# Patient Record
Sex: Female | Born: 1950 | Race: Black or African American | Hispanic: No | Marital: Married | Smoking: Never smoker
Health system: Southern US, Community
[De-identification: ages and names within clinical notes are randomized; demographics above are authoritative.]

## PROBLEM LIST (undated history)

## (undated) DIAGNOSIS — G4730 Sleep apnea, unspecified: Secondary | ICD-10-CM

## (undated) DIAGNOSIS — M1990 Unspecified osteoarthritis, unspecified site: Secondary | ICD-10-CM

## (undated) DIAGNOSIS — E669 Obesity, unspecified: Secondary | ICD-10-CM

## (undated) DIAGNOSIS — I10 Essential (primary) hypertension: Secondary | ICD-10-CM

## (undated) DIAGNOSIS — B029 Zoster without complications: Secondary | ICD-10-CM

## (undated) DIAGNOSIS — J309 Allergic rhinitis, unspecified: Secondary | ICD-10-CM

## (undated) DIAGNOSIS — E785 Hyperlipidemia, unspecified: Secondary | ICD-10-CM

## (undated) DIAGNOSIS — E119 Type 2 diabetes mellitus without complications: Secondary | ICD-10-CM

## (undated) HISTORY — DX: Sleep apnea, unspecified: G47.30

## (undated) HISTORY — DX: Obesity, unspecified: E66.9

## (undated) HISTORY — DX: Unspecified osteoarthritis, unspecified site: M19.90

## (undated) HISTORY — DX: Hypercalcemia: E83.52

## (undated) HISTORY — PX: VAGINAL HYSTERECTOMY: SUR661

## (undated) HISTORY — DX: Zoster without complications: B02.9

## (undated) HISTORY — DX: Essential (primary) hypertension: I10

## (undated) HISTORY — DX: Type 2 diabetes mellitus without complications: E11.9

## (undated) HISTORY — DX: Hyperlipidemia, unspecified: E78.5

## (undated) HISTORY — DX: Allergic rhinitis, unspecified: J30.9

---

## 2005-03-13 HISTORY — PX: BREAST BIOPSY: SHX20

## 2008-03-13 HISTORY — PX: CATARACT EXTRACTION: SUR2

## 2010-03-13 DIAGNOSIS — B029 Zoster without complications: Secondary | ICD-10-CM

## 2010-03-13 HISTORY — DX: Zoster without complications: B02.9

## 2014-08-06 ENCOUNTER — Encounter: Payer: Self-pay | Admitting: Internal Medicine

## 2014-10-07 ENCOUNTER — Ambulatory Visit: Payer: Self-pay | Admitting: Internal Medicine

## 2014-10-09 ENCOUNTER — Ambulatory Visit (INDEPENDENT_AMBULATORY_CARE_PROVIDER_SITE_OTHER): Payer: Medicare Other | Admitting: Internal Medicine

## 2014-10-09 ENCOUNTER — Encounter: Payer: Self-pay | Admitting: Internal Medicine

## 2014-10-09 VITALS — BP 160/90 | HR 80 | Ht 62.5 in | Wt 176.4 lb

## 2014-10-09 DIAGNOSIS — Z1211 Encounter for screening for malignant neoplasm of colon: Secondary | ICD-10-CM

## 2014-10-09 DIAGNOSIS — R030 Elevated blood-pressure reading, without diagnosis of hypertension: Secondary | ICD-10-CM

## 2014-10-09 DIAGNOSIS — IMO0001 Reserved for inherently not codable concepts without codable children: Secondary | ICD-10-CM

## 2014-10-09 DIAGNOSIS — K219 Gastro-esophageal reflux disease without esophagitis: Secondary | ICD-10-CM | POA: Diagnosis not present

## 2014-10-09 NOTE — Patient Instructions (Addendum)
Please follow the GERD diet we have given you today.  If the GERD diet alone does not help your symptoms, you may purchase Pepcid AC over the counter and take 1 tablet every night.  You have been scheduled for a colonoscopy. Please follow written instructions given to you at your visit today.  Please pick up your prep supplies at the pharmacy within the next 1-3 days. If you use inhalers (even only as needed), please bring them with you on the day of your procedure. Your physician has requested that you go to www.startemmi.com and enter the access code given to you at your visit today. This web site gives a general overview about your procedure. However, you should still follow specific instructions given to you by our office regarding your preparation for the procedure.  Today your blood pressure was elevated. Please follow up with your Primary Care Provider for blood pressure management.

## 2014-10-09 NOTE — Progress Notes (Signed)
   Subjective:    Patient ID: Jennifer Osborn, female    DOB: 1950/06/29, 64 y.o.   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 Once maybe twice a week she will awaken with regurgitation and some coughing. She eats at about 7 PM and retires to bed at 10:50 PM. There is no dysphagia or unintentional weight loss or bleeding. She has not tried any medications or dietary changes or change in the elevation of her bed. GI review of systems is otherwise negative.  Medications, allergies, past medical history, past surgical history, family history and social history are reviewed and updated in the EMR.  Review of Systems Recent skin rash all other review of systems negative.    Objective:   Physical Exam  170/90 mmHg  Pulse 80  Ht 5' 2.5" (1.588 m)  Wt 176 lb 6 oz (80.003 kg)  BMI 31.73 kg/m2@  General:  NAD Eyes:   anicteric Lungs:  clear Heart:: S1S2 no rubs, murmurs or gallops Abdomen:  soft and nontender, BS+ Ext:   no edema, cyanosis or clubbing Psych:  Alert and oriented x 3     Assessment & Plan:  Reflux - Plan: Ambulatory referral to Gastroenterology  Colon cancer screening - Plan: Ambulatory referral to Gastroenterology  Elevated BP  1. GERD diet 2. Possible H2 B at bedtime 3. Colonoscopy (Cologuard offered) The risks and benefits as well as alternatives of endoscopic procedure(s) have been discussed and reviewed. All questions answered. The patient agrees to proceed. She says BP at home is ok usu - f/u PCP

## 2014-11-19 ENCOUNTER — Encounter: Payer: Self-pay | Admitting: Internal Medicine

## 2014-11-19 ENCOUNTER — Ambulatory Visit (AMBULATORY_SURGERY_CENTER): Payer: Medicare Other | Admitting: Internal Medicine

## 2014-11-19 VITALS — BP 123/61 | HR 62 | Temp 98.2°F | Resp 21 | Ht 62.5 in | Wt 176.0 lb

## 2014-11-19 DIAGNOSIS — Z1211 Encounter for screening for malignant neoplasm of colon: Secondary | ICD-10-CM

## 2014-11-19 MED ORDER — SODIUM CHLORIDE 0.9 % IV SOLN: 500.0000 mL | INTRAVENOUS | Status: DC

## 2014-11-19 NOTE — Progress Notes (Signed)
  [facility] Anesthesia Post-op Note  Patient: Jennifer Osborn  Procedure(s) Performed: colonoscopy  Patient Location: [facility] - Recovery Area  Anesthesia Type: Deep Sedation/Propofol  Level of Consciousness: awake, oriented and patient cooperative  Airway and Oxygen Therapy: Patient Spontanous Breathing  Post-op Pain: none  Post-op Assessment:  Post-op Vital signs reviewed, Patient's Cardiovascular Status Stable, Respiratory Function Stable, Patent Airway, No signs of Nausea or vomiting and Pain level controlled  Post-op Vital Signs: Reviewed and stable  Complications: No apparent anesthesia complications  Novella Abraha E 12:14 PM

## 2014-11-19 NOTE — Patient Instructions (Addendum)
Colonoscopy was normal!  Consider having another screening in 10 years [id number]).  I appreciate the opportunity to care for you. Iva Boop, MD, FACG   YOU HAD AN ENDOSCOPIC PROCEDURE TODAY AT THE [facility]:   Refer to the procedure report that was given to you for any specific questions about what was found during the examination.  If the procedure report does not answer your questions, please call your gastroenterologist to clarify.  If you requested that your care partner not be given the details of your procedure findings, then the procedure report has been included in a sealed envelope for you to review at your convenience later.  YOU SHOULD EXPECT: Some feelings of bloating in the abdomen. Passage of more gas than usual.  Walking can help get rid of the air that was put into your GI tract during the procedure and reduce the bloating. If you had a lower endoscopy (such as a colonoscopy or flexible sigmoidoscopy) you may notice spotting of blood in your stool or on the toilet paper. If you underwent a bowel prep for your procedure, you may not have a normal bowel movement for a few days.  Please Note:  You might notice some irritation and congestion in your nose or some drainage.  This is from the oxygen used during your procedure.  There is no need for concern and it should clear up in a day or so.  SYMPTOMS TO REPORT IMMEDIATELY:   Following lower endoscopy (colonoscopy or flexible sigmoidoscopy):  Excessive amounts of blood in the stool  Significant tenderness or worsening of abdominal pains  Swelling of the abdomen that is new, acute  Fever of 100F or higher   Following upper endoscopy (EGD)  Vomiting of blood or coffee ground material  New chest pain or pain under the shoulder blades  Painful or persistently difficult swallowing  New shortness of breath  Fever of 100F or higher  Black, tarry-looking stools  For urgent or emergent issues, a  gastroenterologist can be reached at any hour by calling [phone].   DIET: Your first meal following the procedure should be a small meal and then it is ok to progress to your normal diet. Heavy or fried foods are harder to digest and may make you feel nauseous or bloated.  Likewise, meals heavy in dairy and vegetables can increase bloating.  Drink plenty of fluids but you should avoid alcoholic beverages for 24 hours.  ACTIVITY:  You should plan to take it easy for the rest of today and you should NOT DRIVE or use heavy machinery until tomorrow (because of the sedation medicines used during the test).    FOLLOW UP: Our staff will call the number listed on your records the next business day following your procedure to check on you and address any questions or concerns that you may have regarding the information given to you following your procedure. If we do not reach you, we will leave a message.  However, if you are feeling well and you are not experiencing any problems, there is no need to return our call.  We will assume that you have returned to your regular daily activities without incident.  If any biopsies were taken you will be contacted by phone or by letter within the next 1-3 weeks.  Please call us at [phone] if you have not heard about the biopsies in 3 weeks.    SIGNATURES/CONFIDENTIALITY: You and/or your care partner have signed paperwork  which will be entered into your electronic medical record.  These signatures attest to the fact that that the information above on your After Visit Summary has been reviewed and is understood.  Full responsibility of the confidentiality of this discharge information lies with you and/or your care-partner.   Repeat colonoscopy in 10 years

## 2014-11-19 NOTE — Op Note (Signed)
[facility] [address] [place], [address]   COLONOSCOPY PROCEDURE REPORT  PATIENT: Jennifer, Osborn  MR#: [id number] BIRTHDATE: 05/26/1950 , 64  yrs. old GENDER: female ENDOSCOPIST: Iva Boop, MD, [facility] PROCEDURE DATE:  11/19/2014 PROCEDURE:   Colonoscopy, screening First Screening Colonoscopy - Avg.  risk and is 50 yrs.  old or older Yes.  Prior Negative Screening - Now for repeat screening. N/A  History of Adenoma - Now for follow-up colonoscopy & has been > or = to 3 yrs.  N/A  Polyps removed today? No Recommend repeat exam, <10 yrs? No ASA CLASS:   Class II INDICATIONS:Screening for colonic neoplasia and Colorectal Neoplasm Risk Assessment for this procedure is average risk. MEDICATIONS: Propofol 200 mg IV and Monitored anesthesia care  DESCRIPTION OF PROCEDURE:   After the risks benefits and alternatives of the procedure were thoroughly explained, informed consent was obtained.  The digital rectal exam revealed no abnormalities of the rectum.   The LB [id number]  endoscope was introduced through the anus and advanced to the cecum, which was identified by both the appendix and ileocecal valve. No adverse events experienced.   The quality of the prep was excellent. (MiraLax was used)  The instrument was then slowly withdrawn as the colon was fully examined. Estimated blood loss is zero unless otherwise noted in this procedure report.      COLON FINDINGS: A normal appearing cecum, ileocecal valve, and appendiceal orifice were identified.  The ascending, transverse, descending, sigmoid colon, and rectum appeared unremarkable. Retroflexed views revealed no abnormalities. The time to cecum = 2.3 Withdrawal time = 11.0   The scope was withdrawn and the procedure completed. COMPLICATIONS: There were no immediate complications.  ENDOSCOPIC IMPRESSION: Normal colonoscopy - excellent prep  RECOMMENDATIONS: Consider repeat colon screening 10 years.   2026  eSigned:  Iva Boop, MD, [facility] 11/19/2014 12:17 PM   cc: the Patient and Dr. Tana Felts

## 2014-11-20 ENCOUNTER — Telehealth: Payer: Self-pay | Admitting: *Deleted

## 2014-11-20 NOTE — Telephone Encounter (Signed)
Unable to leave message ,not available said voice mail

## 2016-05-16 DIAGNOSIS — Z961 Presence of intraocular lens: Secondary | ICD-10-CM | POA: Insufficient documentation

## 2017-07-17 ENCOUNTER — Encounter: Payer: Self-pay | Admitting: Neurology

## 2017-07-31 NOTE — Progress Notes (Signed)
[facility] Clinic Note - Initial Visit   Date: 08/01/17  Jennifer Osborn MRN: [id number] DOB: 05-Apr-1950   Dear Dr. Vertell Limber:  Thank you for your kind referral of Jennifer Osborn for consultation of left arm weakness and pain. Although her history is well known to you, please allow [place] to reiterate it for the purpose of our medical record. The patient was accompanied to the clinic by self.   History of Present Illness: Jennifer Osborn is a 67 y.o. right-handed African American female with hypertension, hyperlipidemia, and diabetes (HbA1c 6.1) presenting for evaluation of left arm weakness and pain.    Starting around 2017, she began having numbness involving the entire left arm, which has been constant since onset.  No identifiable triggers or alleviating factors.  Recently, she has been having tight sensation over the upper arm.  She has difficulty with fine motor tasks because she drops things frequently from the numbness in her fingers.  In 2018, she developed mild tingling over the fingertips on the right. Prior evaluation has included electrodiagnostic testing which shows mild carpal tunnel syndrome bilaterally. 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    She denies any numbness/tingling of the legs, weakness of the arms or legs, vision changes, or difficulty swallowing/talking.  She occasionally has left leg cramps and episodic jerks.    She did not tolerate wrist splints, so no longer uses them.  She has not done physical therapy.   Out-side paper records, electronic medical record, and images have been reviewed where available and summarized as:  MRI cervical spine without contrast 07/10/2017: Left-sided cord lesion at the C2-3 level unchanged from 12/27/2015. No other cord lesions. This appears to be chronic cord injury possibly chronic demyelinating disease, infarct, or cord injury.  No stenosis is present at this level. Multilevel disc and facet degeneration similar to the prior study.  NCS/EMG 08/15/2016 performed at [facility] by Dr. Brien Few:  Mild bilateral median neuropathy at the wrist (carpal tunnel syndrome).  No evidence for cervical radiculopathy.   Past Medical History:  Diagnosis Date  . Allergic rhinitis   . Arthritis   . DJD (degenerative joint disease)   . DM (diabetes mellitus), type 2 ([place])   . Hypercalcemia   . Hyperlipidemia   . Hypertension   . Obesity   . Shingles 2012   upperback  . Sleep apnea     Past Surgical History:  Procedure Laterality Date  . BREAST BIOPSY Right 2007  . CATARACT EXTRACTION Bilateral 2010   with lens inplants  . VAGINAL HYSTERECTOMY       Medications:  Outpatient Encounter Medications as of 08/01/2017  Medication Sig Note  . aspirin 81 MG tablet Take 81 mg by mouth daily.   . carvedilol (COREG) 12.5 MG tablet Take 12.5 mg by mouth 2 (two) times daily with a meal.   . clobetasol cream (TEMOVATE) 0.05 %  11/19/2014: Received from: External Pharmacy  . ergocalciferol (VITAMIN D2) 50000 UNITS capsule Take 50,000 Units by mouth every 30 (thirty) days.   Marland Kitchen losartan-hydrochlorothiazide (HYZAAR) 100-25 MG per tablet Take 1 tablet by mouth daily.   . Magnesium Gluconate 550 MG TABS Take by mouth.   . metFORMIN (GLUCOPHAGE) 500 MG tablet Take 500 mg by mouth daily with breakfast.   . rosuvastatin (CRESTOR) 20 MG tablet    . [DISCONTINUED] atorvastatin (LIPITOR) 40 MG tablet  Take 40 mg by mouth daily.   . [DISCONTINUED] Multiple Vitamin (MULTIVITAMIN) tablet Take 1 tablet by mouth daily.    No facility-administered encounter medications on file as of 08/01/2017.      Allergies:  Allergies  Allergen Reactions  . Percocet [Oxycodone-Acetaminophen]   . Latex Rash    Family History: Family History  Problem Relation Age of Onset  . Diabetes Sister   . Diabetes Father   . Prostate cancer Father    . Diabetes Brother   . Pancreatic cancer Mother   . Pancreatic cancer Brother   . Prostate cancer Brother   . Colon cancer Neg Hx     Social History: Social History   Tobacco Use  . Smoking status: Never Smoker  . Smokeless tobacco: Never Used  Substance Use Topics  . Alcohol use: No    Alcohol/week: 0.0 oz  . Drug use: No   Social History   Social History Narrative   Married, no children. Retired Tax inspector. One caffeinated beverage daily.   10/09/2014  Lives in a one story home.      Review of Systems:  CONSTITUTIONAL: No fevers, chills, night sweats, or weight loss.   EYES: No visual changes or eye pain ENT: No hearing changes.  No history of nose bleeds.   RESPIRATORY: No cough, wheezing and shortness of breath.   CARDIOVASCULAR: Negative for chest pain, and palpitations.   GI: Negative for abdominal discomfort, blood in stools or black stools.  No recent change in bowel habits.   GU:  No history of incontinence.   MUSCLOSKELETAL: No history of joint pain or swelling.  No myalgias.   SKIN: Negative for lesions, rash, and itching.   HEMATOLOGY/ONCOLOGY: Negative for prolonged bleeding, bruising easily, and swollen nodes.   ENDOCRINE: Negative for cold or heat intolerance, polydipsia or goiter.   PSYCH:  No depression or anxiety symptoms.   NEURO: As Above.   Vital Signs:  BP 110/70   Pulse 76   Ht _0  (1.626 m)   Wt 173 lb 4 oz (78.6 kg)   SpO2 98%   BMI 29.74 kg/m    General Medical Exam:   General:  Well appearing, comfortable.   Eyes/ENT: see cranial nerve examination.   Neck: No masses appreciated.  Full range of motion without tenderness.  No carotid bruits. Respiratory:  Clear to auscultation, good air entry bilaterally.   Cardiac:  Regular rate and rhythm, no murmur.   Extremities:  No deformities, edema, or skin discoloration.  Skin:  No rashes or lesions.  Neurological Exam: MENTAL STATUS including orientation to time, place,  person, recent and remote memory, attention span and concentration, language, and fund of knowledge is normal.  Speech is not dysarthric.  CRANIAL NERVES: II:  No visual field defects.  Unremarkable fundi.   III-IV-VI: Pupils equal round and reactive to light.  Normal conjugate, extra-ocular eye movements in all directions of gaze.  No nystagmus.  No ptosis.   V:  Normal facial sensation.   VII:  Normal facial symmetry and movements.  No pathologic facial reflexes.  VIII:  Normal hearing and vestibular function.   IX-X:  Normal palatal movement.   XI:  Normal shoulder shrug and head rotation.   XII:  Normal tongue strength and range of motion, no deviation or fasciculation.  MOTOR:  No atrophy, fasciculations or abnormal movements.  No pronator drift.   Negative Lhermittes  Right Upper Extremity:    Left Upper Extremity:  Deltoid  5/5   Deltoid  5/5   Biceps  5/5   Biceps  5/5   Triceps  5/5   Triceps  5/5   Wrist extensors  5/5   Wrist extensors  5/5   Wrist flexors  5/5   Wrist flexors  5/5   Finger extensors  5/5   Finger extensors  5/5   Finger flexors  5/5   Finger flexors  5/5   Dorsal interossei  5/5   Dorsal interossei  5/5   Abductor pollicis  5/5   Abductor pollicis  5/5   Tone (Ashworth scale)  0  Tone (Ashworth scale)  0+   Right Lower Extremity:    Left Lower Extremity:    Hip flexors  5/5   Hip flexors  5/5   Hip extensors  5/5   Hip extensors  5/5   Knee flexors  5/5   Knee flexors  5/5   Knee extensors  5/5   Knee extensors  5/5   Dorsiflexors  5/5   Dorsiflexors  5/5   Plantarflexors  5/5   Plantarflexors  5/5   Toe extensors  5/5   Toe extensors  5/5   Toe flexors  5/5   Toe flexors  5/5   Tone (Ashworth scale)  0  Tone (Ashworth scale)  0   MSRs:  Right                                                                 Left brachioradialis 2+  brachioradialis 3+  biceps 2+  biceps 3+  triceps 2+  triceps 3+  patellar 2+  patellar 2+  ankle jerk 2+  ankle  jerk 2+  Hoffman no  Hoffman no  plantar response down  plantar response down   SENSORY: Vibration is reduced to 50% on the left hand as compared to the right.  Vibration is intact in the legs.  She has hyperesthesia to pin prick in the left arm, temperature is intact bilaterally.  COORDINATION/GAIT: Normal finger-to- nose-finger and heel-to-shin.  Intact rapid alternating movements bilaterally.  Gait narrow based and stable. Tandem and stressed gait intact.    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 Her cord lesion certainly explains her left arm numbness and tightness.  I would like to view her MRI images and requested that she bring CD images.  Further, I recommend MRI brain wwo contrast to assess for demyelinating disease, which would suggest that her cervical lesion is and old MS plaque.  If this is inconclusive, CSF testing could be indicated.  Although less likely, nutrient deficiency could also manifest with similar appearance, so will check ESR, copper, and vitamin B12.  Patient is reluctant to have MRI brain as her symptoms are stable for 2 years.  She will call my office, if she chooses to proceed with testing.   For her left arm numbness, I explained that there is no effective medication, and that occupational therapy and muscle relaxants may provide some relief.  She prefers nonpharmacologic therapies and will do her own stretching exercises at home.  For bilateral carpal tunnel syndrome, encouraged her to use wrist splints especially at nighttime.   Return  to clinic as needed  Thank you for allowing me to participate in patient's care.  If I can answer any additional questions, I would be pleased to do so.    Sincerely,    Donika K. Posey Pronto, DO

## 2017-08-01 ENCOUNTER — Other Ambulatory Visit (INDEPENDENT_AMBULATORY_CARE_PROVIDER_SITE_OTHER): Payer: Medicare Other

## 2017-08-01 ENCOUNTER — Encounter: Payer: Self-pay | Admitting: Neurology

## 2017-08-01 ENCOUNTER — Ambulatory Visit (INDEPENDENT_AMBULATORY_CARE_PROVIDER_SITE_OTHER): Payer: Medicare Other | Admitting: Neurology

## 2017-08-01 VITALS — BP 110/70 | HR 76 | Ht 64.0 in | Wt 173.2 lb

## 2017-08-01 DIAGNOSIS — G959 Disease of spinal cord, unspecified: Secondary | ICD-10-CM | POA: Diagnosis not present

## 2017-08-01 DIAGNOSIS — G5603 Carpal tunnel syndrome, bilateral upper limbs: Secondary | ICD-10-CM

## 2017-08-01 DIAGNOSIS — R202 Paresthesia of skin: Secondary | ICD-10-CM | POA: Diagnosis not present

## 2017-08-01 LAB — SEDIMENTATION RATE: SED RATE: 12 mm/h (ref 0–30)

## 2017-08-01 LAB — VITAMIN B12: Vitamin B-12: 501 pg/mL (ref 211–911)

## 2017-08-01 NOTE — Patient Instructions (Addendum)
Please get a CD images so I can review  Check labs.  We will post your results to MyChart  Please call my office if you would like to proceed with MRI brain

## 2017-08-03 LAB — COPPER, SERUM: Copper: 85 ug/dL (ref 70–175)

## 2017-08-16 ENCOUNTER — Telehealth: Payer: Self-pay | Admitting: Neurology

## 2017-08-16 NOTE — Telephone Encounter (Signed)
Rec'd CD with images of patient's MRI cervical spine dated 07/10/2017.  There is a focal area of increased T2 signal abnormality in the left hemicord at the C2-3 level.  Old demyelinating plaque may appear this way and further testing is warranted.  Patient does not wish to pursue additional MRI brain or CSF testing at her last visit.  Message was left on answering machine to call the office if she would like to proceed with testing as previously discussed.  Donika K. Allena Katz, DO

## 2017-08-17 ENCOUNTER — Telehealth: Payer: Self-pay | Admitting: Neurology

## 2017-08-17 NOTE — Telephone Encounter (Signed)
Noted  

## 2017-08-17 NOTE — Telephone Encounter (Signed)
I called patient back and informed her of what Dr. Allena Katz said about her MRI and that Dr. Allena Katz did mention further testing.  She said that she would still like to wait to do any further testing but will call us in a couple of months if she changes her mind.

## 2017-08-17 NOTE — Telephone Encounter (Signed)
Pt left a VM message regarding an MRI and has not heard anything yet

## 2018-03-28 NOTE — Progress Notes (Signed)
Follow-up Visit   Date: 03/29/18    Jennifer Osborn MRN: [id number] DOB: 1950/10/07   Interim History: Salam Alphonso is a 68 y.o. right-handed African American female with hypertension, hyperlipidemia, and diabetes (HbA1c 6.1) returning to the clinic for new complaints of low back pain and left leg pain.  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  The patient was accompanied to the clinic by self.  History of present illness: Starting around 2017, she began having numbness involving the entire left arm, which has been constant since onset.  No identifiable triggers or alleviating factors.  Recently, she has been having tight sensation over the upper arm.  She has difficulty with fine motor tasks because she drops things frequently from the numbness in her fingers.  In 2018, she developed mild tingling over the fingertips on the right. Prior evaluation has included electrodiagnostic testing which shows mild carpal tunnel syndrome bilaterally. 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    She denies any numbness/tingling of the legs, weakness of the arms or legs, vision changes, or difficulty swallowing/talking.  She occasionally has left leg cramps and episodic jerks.    UPDATE 03/29/2018:    Starting around December 2019, she began having soreness in her back and hips.  She also has radiating sharp pain into the right buttocks and back of the thigh.  This is intermittently throughout the day, which is triggered by initiating gait.  Rest helps.  She does not have numbness/tingling or cramps of the legs.    She continues to have left arm numbness which is unchanged.    Medications:  Current Outpatient Medications on File Prior to Visit  Medication Sig Dispense Refill  . aspirin 81 MG tablet Take 81 mg by mouth daily.    Marland Kitchen  CALCIUM-MAGNESIUM-ZINC PO Take by mouth.    . carvedilol (COREG) 12.5 MG tablet Take 12.5 mg by mouth 2 (two) times daily with a meal.    . clobetasol cream (TEMOVATE) 0.05 %   1  . ergocalciferol (VITAMIN D2) 50000 UNITS capsule Take 50,000 Units by mouth every 30 (thirty) days.    Marland Kitchen losartan-hydrochlorothiazide (HYZAAR) 100-25 MG per tablet Take 1 tablet by mouth daily.    . metFORMIN (GLUCOPHAGE) 500 MG tablet Take 500 mg by mouth daily with breakfast.    . rosuvastatin (CRESTOR) 20 MG tablet     . thiamine (VITAMIN B-1) 100 MG tablet Take 100 mg by mouth daily.     No current facility-administered medications on file prior to visit.     Allergies:  Allergies  Allergen Reactions  . Percocet [Oxycodone-Acetaminophen]   . Latex Rash    Review of Systems:  CONSTITUTIONAL: No fevers, chills, night sweats, or weight loss.  EYES: No visual changes or eye pain ENT: No hearing changes.  No history of nose bleeds.   RESPIRATORY: No cough, wheezing and shortness of breath.   CARDIOVASCULAR: Negative for chest pain, and palpitations.   GI: Negative for abdominal discomfort, blood in stools or black stools.  No recent change in bowel habits.   GU:  No history of incontinence.   MUSCLOSKELETAL: No history of joint pain or swelling.  +myalgias.   SKIN: Negative for lesions, rash, and itching.   ENDOCRINE: Negative for cold or heat intolerance, polydipsia or goiter.   PSYCH:  No  depression or anxiety symptoms.   NEURO: As Above.   Vital Signs:  BP (!) 150/90   Pulse 69   Ht 5\' 4"  (1.626 m)   Wt 177 lb (80.3 kg)   SpO2 98%   BMI 30.38 kg/m   General Medical Exam:   General:  Well appearing, comfortable  Eyes/ENT: see cranial nerve examination.   Neck: No masses appreciated.  Full range of motion without tenderness.  No carotid bruits. Respiratory:  Clear to auscultation, good air entry bilaterally.   Cardiac:  Regular rate and rhythm, no murmur.   Ext:  No edema.  Straight leg  raise is positive on the right  Neurological Exam: MENTAL STATUS including orientation to time, place, person, recent and remote memory, attention span and concentration, language, and fund of knowledge is normal.  Speech is not dysarthric.  CRANIAL NERVES: No visual field defects.  Pupils equal round and reactive to light.  Normal conjugate, extra-ocular eye movements in all directions of gaze.  No ptosis.  Face is symmetric. Palate elevates symmetrically.  Tongue is midline.  MOTOR:  Motor strength is 5/5 in all extremities No pronator drift.  Tone is mildly increased in the LUE.    MSRs:  Right                                                                 Left brachioradialis 2+  brachioradialis 3+  biceps 2+  biceps 3+  triceps 2+  triceps 3+  patellar 2+  patellar 2+  ankle jerk 2+  ankle jerk 2+  plantar response down  plantar response down   SENSORY:  Pin prick is reduced over the left dorsum of the arm and hand.  Vibration intact throughout.  Sensation to pin prick is normal in the legs.   COORDINATION/GAIT:  Normal finger-to- nose-finger.  Intact rapid alternating movements bilaterally.  Gait narrow based and stable. Stressed gait is normal  Data: MRI cervical spine dated 07/10/2017.  There is a focal area of increased T2 signal abnormality in the left hemicord at the C2-3 level.  Old demyelinating plaque may appear this way and further testing is warranted.    Lab Results  Component Value Date   VITAMINB12 501 08/01/2017   Lab Results  Component Value Date   ESRSEDRATE 12 08/01/2017     IMPRESSION/PLAN: 1.  Low back pain with radicular left leg symptoms.  Start tizanidine 2mg  at bedtime.  I offered physical therapy for low back strengthening, which she declined and will consider if no improvement with muscle relaxants.  Consider prednisone taper course, if pain gets worse.   2.  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  Lesion is concerning for  possible autoimmune/inflammatory process such as demyelinating lesion vs transverse myelitis. She had declined further testing such as MRI brain and CSF.  Fortunately, she has remained clinically stable and exam is unchanged.   Return to clinic as needed   Thank you for allowing me to participate in patient's care.  If I can answer any additional questions, I would be pleased to do so.    Sincerely,     K. Allena Katz, DO

## 2018-03-29 ENCOUNTER — Encounter: Payer: Self-pay | Admitting: Neurology

## 2018-03-29 ENCOUNTER — Ambulatory Visit (INDEPENDENT_AMBULATORY_CARE_PROVIDER_SITE_OTHER): Payer: Medicare Other | Admitting: Neurology

## 2018-03-29 VITALS — BP 150/90 | HR 69 | Ht 64.0 in | Wt 177.0 lb

## 2018-03-29 DIAGNOSIS — G959 Disease of spinal cord, unspecified: Secondary | ICD-10-CM | POA: Diagnosis not present

## 2018-03-29 DIAGNOSIS — M5442 Lumbago with sciatica, left side: Secondary | ICD-10-CM

## 2018-03-29 DIAGNOSIS — G8929 Other chronic pain: Secondary | ICD-10-CM | POA: Diagnosis not present

## 2018-03-29 MED ORDER — TIZANIDINE HCL 2 MG PO CAPS: 2.0000 mg | ORAL_CAPSULE | Freq: Every evening | ORAL | 2 refills | Status: DC | PRN

## 2018-03-29 NOTE — Patient Instructions (Addendum)
Start tizanidine 2mg  at bedtime for low back pain  If symptoms do not improvement, please call my office to set up physical therapy  Return to clinic in 3 months

## 2018-06-05 ENCOUNTER — Ambulatory Visit: Payer: Medicare Other | Admitting: Neurology

## 2018-07-01 ENCOUNTER — Ambulatory Visit: Payer: Medicare Other | Admitting: Neurology

## 2018-12-10 ENCOUNTER — Other Ambulatory Visit (HOSPITAL_COMMUNITY): Payer: Self-pay | Admitting: Neurosurgery

## 2018-12-10 ENCOUNTER — Other Ambulatory Visit: Payer: Self-pay | Admitting: Neurosurgery

## 2018-12-10 DIAGNOSIS — Q069 Congenital malformation of spinal cord, unspecified: Secondary | ICD-10-CM

## 2018-12-23 ENCOUNTER — Ambulatory Visit (HOSPITAL_COMMUNITY)
Admission: RE | Admit: 2018-12-23 | Discharge: 2018-12-23 | Disposition: A | Discharge status: Home / Self Care | Payer: Medicare Other | Source: Ambulatory Visit | Attending: Neurosurgery | Admitting: Neurosurgery

## 2018-12-23 ENCOUNTER — Other Ambulatory Visit: Payer: Self-pay

## 2018-12-23 DIAGNOSIS — Q069 Congenital malformation of spinal cord, unspecified: Secondary | ICD-10-CM

## 2018-12-23 LAB — POCT I-STAT CREATININE: Creatinine, Ser: 0.7 mg/dL (ref 0.44–1.00)

## 2018-12-23 MED ORDER — GADOBUTROL 1 MMOL/ML IV SOLN
7.0000 mL | Freq: Once | INTRAVENOUS | Status: AC | PRN
  Administered 2018-12-23: 7 mL via INTRAVENOUS

## 2019-01-27 ENCOUNTER — Other Ambulatory Visit: Payer: Self-pay

## 2019-01-27 ENCOUNTER — Encounter: Payer: Self-pay | Admitting: Neurology

## 2019-01-27 ENCOUNTER — Ambulatory Visit (INDEPENDENT_AMBULATORY_CARE_PROVIDER_SITE_OTHER): Payer: Medicare Other | Admitting: Neurology

## 2019-01-27 VITALS — BP 148/77 | HR 67 | Ht 64.0 in | Wt 179.0 lb

## 2019-01-27 DIAGNOSIS — R29898 Other symptoms and signs involving the musculoskeletal system: Secondary | ICD-10-CM

## 2019-01-27 DIAGNOSIS — G5603 Carpal tunnel syndrome, bilateral upper limbs: Secondary | ICD-10-CM | POA: Diagnosis not present

## 2019-01-27 DIAGNOSIS — G959 Disease of spinal cord, unspecified: Secondary | ICD-10-CM | POA: Diagnosis not present

## 2019-01-27 NOTE — Patient Instructions (Addendum)
MRI brain with and without contrast at [facility]    We will call you with the results of your MRI and let you know the next step.

## 2019-01-27 NOTE — Progress Notes (Signed)
Follow-up Visit   Date: 01/27/19    Jennifer Osborn MRN: [id number] DOB: 02-Nov-1950   Interim History: Jennifer Osborn is a 68 y.o. right-handed African American female with hypertension, hyperlipidemia, and diabetes returning to the clinic for follow-up on spinal cord abnormality.  The patient was accompanied to the clinic by self.  History of present illness: Starting around 2017, she began having numbness involving the entire left arm, which has been constant since onset.  No identifiable triggers or alleviating factors.  Recently, she has been having tight sensation over the upper arm.  She has difficulty with fine motor tasks because she drops things frequently from the numbness in her fingers.  In 2018, she developed mild tingling over the fingertips on the right. Prior evaluation has included electrodiagnostic testing which shows mild carpal tunnel syndrome bilaterally. 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    She denies any numbness/tingling of the legs, weakness of the arms or legs, vision changes, or difficulty swallowing/talking.  She occasionally has left leg cramps and episodic jerks.    UPDATE 01/27/2019:    She is here for follow-up.  At her prior visit, I recommended MRI brain and CSF testing to further evaluation her C2-3 cord abnormality, which was declined.  She most recently saw Dr. Vertell Limber who urged her to follow-up with me.  She has noticed mild increase in the numbness/tingling of the hands and mild weakness.  She does not have any leg numbness/tingling or weakness.  Repeat MRI cervical spine wwo contrast continues to show stable posterolateral cord T2 hyperintensity at C2-3, unchanged.    Medications:  Current Outpatient Medications on File Prior to Visit  Medication Sig Dispense Refill  . aspirin 81 MG tablet Take 81 mg by mouth daily.    Marland Kitchen CALCIUM-MAGNESIUM-ZINC PO Take by mouth.     . carvedilol (COREG) 12.5 MG tablet Take 12.5 mg by mouth 2 (two) times daily with a meal.    . clobetasol cream (TEMOVATE) 0.05 %   1  . ergocalciferol (VITAMIN D2) 50000 UNITS capsule Take 50,000 Units by mouth every 30 (thirty) days.    Marland Kitchen losartan-hydrochlorothiazide (HYZAAR) 100-25 MG per tablet Take 1 tablet by mouth daily.    . metFORMIN (GLUCOPHAGE) 500 MG tablet Take 500 mg by mouth daily with breakfast.    . rosuvastatin (CRESTOR) 20 MG tablet     . thiamine (VITAMIN B-1) 100 MG tablet Take 100 mg by mouth daily.    . tizanidine (ZANAFLEX) 2 MG capsule Take 1 capsule (2 mg total) by mouth at bedtime as needed for muscle spasms (low back pain). 30 capsule 2   No current facility-administered medications on file prior to visit.     Allergies:  Allergies  Allergen Reactions  . Percocet [Oxycodone-Acetaminophen]   . Latex Rash    Review of Systems:  CONSTITUTIONAL: No fevers, chills, night sweats, or weight loss.  EYES: No visual changes or eye pain ENT: No hearing changes.  No history of nose bleeds.   RESPIRATORY: No cough, wheezing and shortness of breath.   CARDIOVASCULAR: Negative for chest pain, and palpitations.   GI: Negative for abdominal discomfort, blood in stools or black stools.  No recent change in bowel habits.   GU:  No history of incontinence.   MUSCLOSKELETAL: No history of joint pain or swelling.  +myalgias.   SKIN: Negative for lesions, rash, and  itching.   ENDOCRINE: Negative for cold or heat intolerance, polydipsia or goiter.   PSYCH:  No depression or anxiety symptoms.   NEURO: As Above.   Vital Signs:  BP (!) 148/77   Pulse 67   Ht 5\' 4"  (1.626 m)   Wt 179 lb (81.2 kg)   BMI 30.73 kg/m   General Medical Exam:   General:  Well appearing, comfortable  Eyes/ENT: see cranial nerve examination.   Neck:   No carotid bruits. Respiratory:  Clear to auscultation, good air entry bilaterally.   Cardiac:  Regular rate and rhythm, no murmur.   Ext:   No edema  Neurological Exam: MENTAL STATUS including orientation to time, place, person, recent and remote memory, attention span and concentration, language, and fund of knowledge is normal.  Speech is not dysarthric.  CRANIAL NERVES: No visual field defects.  Pupils equal round and reactive to light.  Normal conjugate, extra-ocular eye movements in all directions of gaze.  No ptosis.  Symmetric smile.  Tongue is midline.   MOTOR:  Motor strength is 5/5 in all extremities, except trace mild weakness with finger abductors and ABP bilaterally (5-/5). No pronator drift.  Tone is normal   MSRs:  Right                                                                 Left brachioradialis 2+  brachioradialis 2+  biceps 2+  biceps 2+  triceps 2+  triceps 2+  patellar 3+  patellar 3+  ankle jerk 2+  ankle jerk 2+  plantar response down  plantar response down   SENSORY:  Temperature and pin prick is reduced over the palmer surface of the hands bilaterally, intact in the forearm and legs.  Vibration is normal throughout.  COORDINATION/GAIT:  Normal finger-to- nose-finger.  Intact rapid alternating movements bilaterally.  Gait narrow based and stable.   Data: MRI cervical spine dated 07/10/2017.  There is a focal area of increased T2 signal abnormality in the left hemicord at the C2-3 level.  Old demyelinating plaque may appear this way and further testing is warranted.    MRI cervical spine wwo contrast 12/23/2018: 1. Similar appearance of left posterolateral cord T2 signal hyperintensity at C2-3 level. There is no associated enhancement. This most likely reflects demyelinating disease given the asymmetric appearance. Cord ischemia is still considered, but less likely given the symmetry. 2. No new lesions or significant other cord signal abnormality or enhancement. 3. Multilevel degenerative changes of the cervical spine are otherwise stable.  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 Clinically, she report mild progression of numbness in the hands. I have discussed further testing at her prior visit, which has been declined.  Repeat MRI cervical spine from October 2020 was personally viewed and shows stable T2 hyperintensity involving the posterolateral cord on the left at C2-3.  She is agreeable to investigate symptoms further today so will plan to get MRI brain wwo contrast to look for intracranial white matter changes. If MRI brain does not support demyelinating disease, the next step will be CSF  testing.  Further recommendations pending results.  Greater than 50% of this 25 minute visit was spent in counseling, explanation of diagnosis, planning of further management, and coordination of care.   Thank you for allowing me to participate in patient's care.  If I can answer any additional questions, I would be pleased to do so.    Sincerely,    Donika K. Allena Katz, DO

## 2019-02-10 ENCOUNTER — Telehealth: Payer: Self-pay | Admitting: Neurology

## 2019-05-07 ENCOUNTER — Other Ambulatory Visit: Payer: Self-pay

## 2019-05-07 ENCOUNTER — Ambulatory Visit (INDEPENDENT_AMBULATORY_CARE_PROVIDER_SITE_OTHER): Payer: Medicare Other | Admitting: Orthopedic Surgery

## 2019-05-07 ENCOUNTER — Encounter: Payer: Self-pay | Admitting: Orthopedic Surgery

## 2019-05-07 VITALS — BP 131/67 | HR 63 | Temp 98.6°F | Ht 64.0 in | Wt 177.4 lb

## 2019-05-07 DIAGNOSIS — M65332 Trigger finger, left middle finger: Secondary | ICD-10-CM

## 2019-05-07 DIAGNOSIS — M65311 Trigger thumb, right thumb: Secondary | ICD-10-CM | POA: Diagnosis not present

## 2019-05-07 NOTE — Patient Instructions (Signed)
Trigger Finger  Trigger finger, also called stenosing tenosynovitis,  is a condition that causes a finger to get stuck in a bent position. Each finger has a tendon, which is a tough, cord-like tissue that connects muscle to bone, and each tendon passes through a tunnel of tissue called a tendon sheath. To move your finger, your tendon needs to glide freely through the sheath. Trigger finger happens when the tendon or the sheath thickens, making it difficult to move your finger. Trigger finger can affect any finger or a thumb. It may affect more than one finger. Mild cases may clear up with rest and medicine. Severe cases require more treatment. What are the causes? Trigger finger is caused by a thickened finger tendon or tendon sheath. The cause of this thickening is not known. What increases the risk? The following factors may make you more likely to develop this condition:  Doing activities that require a strong grip.  Having rheumatoid arthritis, gout, or diabetes.  Being 40-60 years old.  Being female. What are the signs or symptoms? Symptoms of this condition include:  Pain when bending or straightening your finger.  Tenderness or swelling where your finger attaches to the palm of your hand.  A lump in the palm of your hand or on the inside of your finger.  Hearing a noise like a pop or a snap when you try to straighten your finger.  Feeling a catching or locking sensation when you try to straighten your finger.  Being unable to straighten your finger. How is this diagnosed? This condition is diagnosed based on your symptoms and a physical exam. How is this treated? This condition may be treated by:  Resting your finger and avoiding activities that make symptoms worse.  Wearing a finger splint to keep your finger extended.  Taking NSAIDs, such as ibuprofen, to relieve pain and swelling.  Doing gentle exercises to stretch the finger as told by your health care provider.   Having medicine that reduces swelling and inflammation (steroids) injected into the tendon sheath. Injections may need to be repeated.  Having surgery to open the tendon sheath. This may be done if other treatments do not work and you cannot straighten your finger. You may need physical therapy after surgery. Follow these instructions at home: If you have a splint:  Wear the splint as told by your health care provider. Remove it only as told by your health care provider.  Loosen it if your fingers tingle, become numb, or turn cold and blue.  Keep it clean.  If the splint is not waterproof: ? Do not let it get wet. ? Cover it with a watertight covering when you take a bath or shower. Managing pain, stiffness, and swelling     If directed, apply heat to the affected area as often as told by your health care provider. Use the heat source that your health care provider recommends, such as a moist heat pack or a heating pad.  Place a towel between your skin and the heat source.  Leave the heat on for 20-30 minutes.  Remove the heat if your skin turns bright red. This is especially important if you are unable to feel pain, heat, or cold. You may have a greater risk of getting burned. If directed, put ice on the painful area. To do this:  If you have a removable splint, remove it as told by your health care provider.  Put ice in a plastic bag.  Place a   towel between your skin and the bag or between your splint and the bag.  Leave the ice on for 20 minutes, 2-3 times a day.  Activity  Rest your finger as told by your health care provider. Avoid activities that make the pain worse.  Return to your normal activities as told by your health care provider. Ask your health care provider what activities are safe for you.  Do exercises as told by your health care provider.  Ask your health care provider when it is safe to drive if you have a splint on your hand. General instructions   Take over-the-counter and prescription medicines only as told by your health care provider.  Keep all follow-up visits as told by your health care provider. This is important. Contact a health care provider if:  Your symptoms are not improving with home care. Summary  Trigger finger, also called stenosing tenosynovitis, causes your finger to get stuck in a bent position. This can make it difficult and painful to straighten your finger.  This condition develops when a finger tendon or tendon sheath thickens.  Treatment may include resting your finger, wearing a splint, and taking medicines.  In severe cases, surgery to open the tendon sheath may be needed. This information is not intended to replace advice given to you by your health care provider. Make sure you discuss any questions you have with your health care provider. Document Revised: 07/15/2018 Document Reviewed: 07/15/2018 Elsevier Patient Education  2020 [employer]  

## 2019-05-07 NOTE — Progress Notes (Signed)
Jennifer Osborn  05/07/2019  Body mass index is 30.45 kg/m.   HISTORY SECTION :  Chief Complaint  Patient presents with  . Hand Pain    Left middle finger pain locks up hurts in joint also right thumb locking up x3 weeks    HPI 69 year old female with diabetes presents with right thumb catching and locking and left long finger catching and locking for about 3 or 4 weeks no prior treatment   ROS   has a past medical history of Allergic rhinitis, Arthritis, DJD (degenerative joint disease), DM (diabetes mellitus), type 2 ([place]), Hypercalcemia, Hyperlipidemia, Hypertension, Obesity, Shingles (2012), and Sleep apnea.   Past Surgical History:  Procedure Laterality Date  . BREAST BIOPSY Right 2007  . CATARACT EXTRACTION Bilateral 2010   with lens inplants  . VAGINAL HYSTERECTOMY      Body mass index is 30.45 kg/m.   Allergies  Allergen Reactions  . Percocet [Oxycodone-Acetaminophen]   . Latex Rash     Current Outpatient Medications:  .  aspirin 81 MG tablet, Take 81 mg by mouth daily., Disp: , Rfl:  .  CALCIUM-MAGNESIUM-ZINC PO, Take by mouth., Disp: , Rfl:  .  carvedilol (COREG) 12.5 MG tablet, Take 12.5 mg by mouth 2 (two) times daily with a meal., Disp: , Rfl:  .  clobetasol cream (TEMOVATE) 0.05 %, , Disp: , Rfl: 1 .  ergocalciferol (VITAMIN D2) 50000 UNITS capsule, Take 50,000 Units by mouth every 30 (thirty) days., Disp: , Rfl:  .  losartan-hydrochlorothiazide (HYZAAR) 100-25 MG per tablet, Take 1 tablet by mouth daily., Disp: , Rfl:  .  metFORMIN (GLUCOPHAGE) 500 MG tablet, Take 500 mg by mouth daily with breakfast., Disp: , Rfl:  .  rosuvastatin (CRESTOR) 20 MG tablet, , Disp: , Rfl:  .  tizanidine (ZANAFLEX) 2 MG capsule, Take 1 capsule (2 mg total) by mouth at bedtime as needed for muscle spasms (low back pain)., Disp: 30 capsule, Rfl: 2 .  thiamine (VITAMIN B-1) 100 MG tablet, Take 100 mg by mouth daily., Disp: , Rfl:    PHYSICAL EXAM SECTION: 1) BP 131/67 (BP  Location: Right Arm, Patient Position: Sitting)   Pulse 63   Temp 98.6 F (37 C)   Ht 5\' 4"  (1.626 m)   Wt 177 lb 6.4 oz (80.5 kg)   BMI 30.45 kg/m   Body mass index is 30.45 kg/m. General appearance: Well-developed well-nourished no gross deformities  2) Cardiovascular normal pulse and perfusion , normal color   3) Neurologically deep tendon reflexes are equal and normal, no sensation loss or deficits no pathologic reflexes  4) Psychological: Awake alert and oriented x3 mood and affect normal  5) Skin no lacerations or ulcerations no nodularity no palpable masses, no erythema or nodularity  6) Musculoskeletal:   Right thumb is tender over the A1 pulley demonstrates clicking and popping but full range of motion is noted neurovascular exam is intact  Left long finger tender over the A1 pulley as well with clicking and popping demonstrates locking but full range of motion overall neurovascular exam is intact  MEDICAL DECISION MAKING  A.  Encounter Diagnoses  Name Primary?  . Trigger thumb of right hand Yes  . Trigger finger, left middle finger     B. DATA ANALYSED:  IMAGING: Independent interpretation of images: no   Orders: no   Outside records reviewed: no   C. MANAGEMENT  Coumba opted for splinting so we made her aluminum splints which she can remove for  bathing we gave her tape to retaped them she will wear them for 3 weeks and then come back for follow-up to see if she needs injection  No orders of the defined types were placed in this encounter.     Fuller Canada, MD  05/07/2019 9:27 AM

## 2019-05-30 ENCOUNTER — Encounter: Payer: Self-pay | Admitting: Orthopedic Surgery

## 2019-05-30 ENCOUNTER — Other Ambulatory Visit: Payer: Self-pay

## 2019-05-30 ENCOUNTER — Ambulatory Visit (INDEPENDENT_AMBULATORY_CARE_PROVIDER_SITE_OTHER): Payer: Medicare Other | Admitting: Orthopedic Surgery

## 2019-05-30 VITALS — BP 151/75 | HR 69 | Ht 64.0 in | Wt 175.0 lb

## 2019-05-30 DIAGNOSIS — M65311 Trigger thumb, right thumb: Secondary | ICD-10-CM | POA: Diagnosis not present

## 2019-05-30 DIAGNOSIS — M65332 Trigger finger, left middle finger: Secondary | ICD-10-CM | POA: Diagnosis not present

## 2019-05-30 NOTE — Progress Notes (Signed)
Chief Complaint  Patient presents with  . Hand Problem    right thumb trigger and left middle trigger fingers     Trigger finger right thumb and left long or middle finger treated with splinting no injection  Right thumb is improved significantly  The left long finger has improved but still cannot quite make a fist  Still has tenderness over the A1 pulley  Recommend continued splinting follow-up in 3 weeks  Encounter Diagnoses  Name Primary?  . Trigger thumb of right hand Yes  . Trigger finger, left middle finger    2 stable chronic problems no additional data and minimal risk

## 2019-05-30 NOTE — Patient Instructions (Signed)
Left long finger splinting

## 2019-06-20 ENCOUNTER — Encounter: Payer: Self-pay | Admitting: Orthopedic Surgery

## 2019-06-20 ENCOUNTER — Ambulatory Visit (INDEPENDENT_AMBULATORY_CARE_PROVIDER_SITE_OTHER): Payer: Medicare Other | Admitting: Orthopedic Surgery

## 2019-06-20 ENCOUNTER — Other Ambulatory Visit: Payer: Self-pay

## 2019-06-20 VITALS — Temp 98.7°F | Ht 64.0 in | Wt 175.0 lb

## 2019-06-20 DIAGNOSIS — M65332 Trigger finger, left middle finger: Secondary | ICD-10-CM

## 2019-06-20 DIAGNOSIS — M65311 Trigger thumb, right thumb: Secondary | ICD-10-CM

## 2019-06-20 NOTE — Progress Notes (Signed)
Chief Complaint  Patient presents with  . Hand Pain    left middle finger followup trigger finger, cannot bend, very swollen    Likisha comes back to [place] after 6 weeks of splinting for a right thumb trigger tenosynovitis and a left long finger same.  She says the right thumb is definitely good the left finger still is swollen and she cannot bend it down  We discussed possible treatment options of continued splinting injection surgery today she is opted for left long middle finger injection  Trigger finger injection  Diagnosis tenosynovitis left long or middle finger Procedure injection A1 pulley Medications lidocaine 1% 1 mL and Depo-Medrol 40 mg 1 mL Skin prep alcohol and ethyl chloride Verbal consent was obtained Timeout confirmed the injection site  After cleaning the skin with alcohol and anesthetizing the skin with ethyl chloride the A1 pulley was palpated and the injection was performed without complication  Encounter Diagnoses  Name Primary?  . Trigger thumb of right hand   . Trigger finger, left middle finger Yes    Plan to see her in 3 weeks and discuss if further treatment is needed including surgery

## 2019-06-20 NOTE — Patient Instructions (Addendum)
You have received an injection of steroids into the joint. 15% of patients will have increased pain within the 24 hours postinjection.   This is transient and will go away.   We recommend that you use ice packs on the injection site for 20 minutes every 2 hours and extra strength Tylenol 2 tablets every 8 as needed until the pain resolves.  If you continue to have pain after taking the Tylenol and using the ice please call the office for further instructions.  Trigger Finger  Trigger finger, also called stenosing tenosynovitis,  is a condition that causes a finger to get stuck in a bent position. Each finger has a tendon, which is a tough, cord-like tissue that connects muscle to bone, and each tendon passes through a tunnel of tissue called a tendon sheath. To move your finger, your tendon needs to glide freely through the sheath. Trigger finger happens when the tendon or the sheath thickens, making it difficult to move your finger. Trigger finger can affect any finger or a thumb. It may affect more than one finger. Mild cases may clear up with rest and medicine. Severe cases require more treatment. What are the causes? Trigger finger is caused by a thickened finger tendon or tendon sheath. The cause of this thickening is not known. What increases the risk? The following factors may make you more likely to develop this condition:  Doing activities that require a strong grip.  Having rheumatoid arthritis, gout, or diabetes.  Being 40-60 years old.  Being female. What are the signs or symptoms? Symptoms of this condition include:  Pain when bending or straightening your finger.  Tenderness or swelling where your finger attaches to the palm of your hand.  A lump in the palm of your hand or on the inside of your finger.  Hearing a noise like a pop or a snap when you try to straighten your finger.  Feeling a catching or locking sensation when you try to straighten your finger.  Being  unable to straighten your finger. How is this diagnosed? This condition is diagnosed based on your symptoms and a physical exam. How is this treated? This condition may be treated by:  Resting your finger and avoiding activities that make symptoms worse.  Wearing a finger splint to keep your finger extended.  Taking NSAIDs, such as ibuprofen, to relieve pain and swelling.  Doing gentle exercises to stretch the finger as told by your health care provider.  Having medicine that reduces swelling and inflammation (steroids) injected into the tendon sheath. Injections may need to be repeated.  Having surgery to open the tendon sheath. This may be done if other treatments do not work and you cannot straighten your finger. You may need physical therapy after surgery. Follow these instructions at home: If you have a splint:  Wear the splint as told by your health care provider. Remove it only as told by your health care provider.  Loosen it if your fingers tingle, become numb, or turn cold and blue.  Keep it clean.  If the splint is not waterproof: ? Do not let it get wet. ? Cover it with a watertight covering when you take a bath or shower. Managing pain, stiffness, and swelling     If directed, apply heat to the affected area as often as told by your health care provider. Use the heat source that your health care provider recommends, such as a moist heat pack or a heating pad.  Place   a towel between your skin and the heat source.  Leave the heat on for 20-30 minutes.  Remove the heat if your skin turns bright red. This is especially important if you are unable to feel pain, heat, or cold. You may have a greater risk of getting burned. If directed, put ice on the painful area. To do this:  If you have a removable splint, remove it as told by your health care provider.  Put ice in a plastic bag.  Place a towel between your skin and the bag or between your splint and the  bag.  Leave the ice on for 20 minutes, 2-3 times a day.  Activity  Rest your finger as told by your health care provider. Avoid activities that make the pain worse.  Return to your normal activities as told by your health care provider. Ask your health care provider what activities are safe for you.  Do exercises as told by your health care provider.  Ask your health care provider when it is safe to drive if you have a splint on your hand. General instructions  Take over-the-counter and prescription medicines only as told by your health care provider.  Keep all follow-up visits as told by your health care provider. This is important. Contact a health care provider if:  Your symptoms are not improving with home care. Summary  Trigger finger, also called stenosing tenosynovitis, causes your finger to get stuck in a bent position. This can make it difficult and painful to straighten your finger.  This condition develops when a finger tendon or tendon sheath thickens.  Treatment may include resting your finger, wearing a splint, and taking medicines.  In severe cases, surgery to open the tendon sheath may be needed. This information is not intended to replace advice given to you by your health care provider. Make sure you discuss any questions you have with your health care provider. Document Revised: 07/15/2018 Document Reviewed: 07/15/2018 Elsevier Patient Education  2020 [employer]  

## 2019-07-14 ENCOUNTER — Other Ambulatory Visit: Payer: Self-pay

## 2019-07-14 ENCOUNTER — Ambulatory Visit (INDEPENDENT_AMBULATORY_CARE_PROVIDER_SITE_OTHER): Payer: Medicare Other | Admitting: Orthopedic Surgery

## 2019-07-14 ENCOUNTER — Encounter: Payer: Self-pay | Admitting: Orthopedic Surgery

## 2019-07-14 VITALS — BP 155/71 | HR 69 | Ht 64.0 in | Wt 170.0 lb

## 2019-07-14 DIAGNOSIS — M65311 Trigger thumb, right thumb: Secondary | ICD-10-CM | POA: Diagnosis not present

## 2019-07-14 DIAGNOSIS — M65332 Trigger finger, left middle finger: Secondary | ICD-10-CM

## 2019-07-14 NOTE — Progress Notes (Signed)
Chief Complaint  Patient presents with  . Hand Problem    left middle / better but still catching     Jennifer Osborn 69 years old she has a left long or middle finger trigger phenomena also had a right thumb trigger. Both were treated with splinting but on April 9 she came in with continued issues with the left long finger and was treated with injection  She is here for 3-week follow-up to discuss further treatment options  Encounter Diagnoses  Name Primary?  . Trigger thumb of right hand Yes  . Trigger finger, left middle finger    The left long finger has recovered nicely. Is still a little stiff as a little tender but there is no locking or catching  Recommend over-the-counter Advil or Aleve  I am putting her in for 4-week appointment but if everything clears up she can cancel it

## 2019-07-14 NOTE — Patient Instructions (Signed)
Over the counter   advil 400 mg 3 x a day   Or   Aleve twice a day for 10 days

## 2019-08-18 ENCOUNTER — Other Ambulatory Visit: Payer: Self-pay

## 2019-08-18 ENCOUNTER — Ambulatory Visit (INDEPENDENT_AMBULATORY_CARE_PROVIDER_SITE_OTHER): Payer: Medicare Other | Admitting: Orthopedic Surgery

## 2019-08-18 ENCOUNTER — Encounter: Payer: Self-pay | Admitting: Orthopedic Surgery

## 2019-08-18 VITALS — BP 149/76 | HR 70 | Ht 64.0 in | Wt 177.0 lb

## 2019-08-18 DIAGNOSIS — M65332 Trigger finger, left middle finger: Secondary | ICD-10-CM

## 2019-08-18 NOTE — Progress Notes (Signed)
Chief Complaint  Patient presents with  . Hand Problem    left middle finger still not extending completely still swollen    Jennifer Osborn 69 years old she has a left long or middle finger trigger phenomena also had a right thumb trigger. Both were treated with splinting but on April 9 she came in with continued issues with the left long finger and was treated with injection   She is here for 3-week follow-up to discuss further treatment options       Encounter Diagnoses  Name Primary?  . Trigger thumb of right hand Yes  . Trigger finger, left middle finger     Trigger finger injection   Diagnosis tenosynovitis left long or middle finger Procedure injection A1 pulley  Clovia has improved pain in her left long finger she says a small contracture flexible of the PIP joint of the left long finger mild tenderness in the A1 pulley  She has a history of carpal tunnel syndrome with some numbness in the left upper extremity  She was able to get fairly good grip strength  We decided to not do any injections we did recommend some gentle stretching of the PIP joint follow-up as needed  Encounter Diagnosis  Name Primary?  . Trigger finger, left middle finger Yes

## 2019-08-28 ENCOUNTER — Telehealth: Payer: Self-pay | Admitting: Neurology

## 2019-08-28 ENCOUNTER — Other Ambulatory Visit: Payer: Self-pay

## 2019-08-28 DIAGNOSIS — G959 Disease of spinal cord, unspecified: Secondary | ICD-10-CM

## 2019-08-28 NOTE — Telephone Encounter (Signed)
Patient was to have a Brain scan but it was during the COVID time and did not have it done. She would like to go ahead and have that done now  Please call patient

## 2019-08-28 NOTE — Telephone Encounter (Signed)
Called pts cell number (phone # in message incorrect) and was unable to leave message. New order entered and prior auth sent to [facility]. Will need to call [facility] to schedule for pts request of [facility] once auth received.

## 2019-08-29 NOTE — Telephone Encounter (Signed)
Left VM that appt for MRI scheduled for July 16 @ 5:00, arrival at 4:30 at [facility]

## 2019-09-26 ENCOUNTER — Ambulatory Visit (HOSPITAL_COMMUNITY)
Admission: RE | Admit: 2019-09-26 | Discharge: 2019-09-26 | Disposition: A | Discharge status: Home / Self Care | Payer: Medicare Other | Source: Ambulatory Visit | Attending: Neurology | Admitting: Neurology

## 2019-09-26 ENCOUNTER — Other Ambulatory Visit: Payer: Self-pay

## 2019-09-26 DIAGNOSIS — G959 Disease of spinal cord, unspecified: Secondary | ICD-10-CM | POA: Diagnosis present

## 2019-09-26 MED ORDER — GADOBUTROL 1 MMOL/ML IV SOLN
7.0000 mL | Freq: Once | INTRAVENOUS | Status: AC | PRN
  Administered 2019-09-26: 7 mL via INTRAVENOUS

## 2019-10-01 ENCOUNTER — Telehealth: Payer: Self-pay

## 2019-10-01 NOTE — Telephone Encounter (Signed)
Pt called no answer voice mail left for pt to call the office back  

## 2019-10-01 NOTE — Telephone Encounter (Signed)
-----   Message from Van Clines, MD sent at 09/30/2019  3:43 PM EDT ----- Pls let her know that the MRI brain did not show any evidence of tumor, stroke, or bleed. It showed nonspecific changes which can be seen with patients with conditions such as hypertension, cholesterol, or diabetes. Not clearly MS, would await further instructions from Dr. Allena Katz when she returns next week. Thanks

## 2019-10-01 NOTE — Telephone Encounter (Signed)
Pt called and informed that MRI brain did not show any evidence of tumor, stroke, or bleed. It showed nonspecific changes which can be seen with patients with conditions such as hypertension, cholesterol, or diabetes. Not clearly MS, would await further instructions from Dr. Allena Katz when she returns next week. Pt verbalized understanding

## 2019-10-01 NOTE — Telephone Encounter (Signed)
-----   Message from Karen M Aquino, MD sent at 09/30/2019  3:43 PM EDT ----- Pls let her know that the MRI brain did not show any evidence of tumor, stroke, or bleed. It showed nonspecific changes which can be seen with patients with conditions such as hypertension, cholesterol, or diabetes. Not clearly MS, would await further instructions from Dr. Patel when she returns next week. Thanks 

## 2019-10-13 ENCOUNTER — Other Ambulatory Visit: Payer: Self-pay

## 2019-10-13 ENCOUNTER — Ambulatory Visit (INDEPENDENT_AMBULATORY_CARE_PROVIDER_SITE_OTHER): Payer: Medicare Other | Admitting: Neurology

## 2019-10-13 ENCOUNTER — Encounter: Payer: Self-pay | Admitting: Neurology

## 2019-10-13 VITALS — BP 153/82 | HR 62 | Ht 64.0 in | Wt 177.2 lb

## 2019-10-13 DIAGNOSIS — G959 Disease of spinal cord, unspecified: Secondary | ICD-10-CM | POA: Diagnosis not present

## 2019-10-13 DIAGNOSIS — G35 Multiple sclerosis: Secondary | ICD-10-CM | POA: Insufficient documentation

## 2019-10-13 NOTE — Progress Notes (Signed)
MRI cervical spine ordered, will call pt with appointment time, [facility]

## 2019-10-13 NOTE — Patient Instructions (Addendum)
Your testing is most suggestive of multiple sclerosis.  Medications to consider include Mayzent, tecfidera, or Vumerity.   MRI cervical spine with and without contrast in November/December  Return to clinic in December

## 2019-10-13 NOTE — Progress Notes (Signed)
Follow-up Visit   Date: 10/13/19    Jennifer Osborn MRN: [id number] DOB: 1950/08/07   Interim History: Jennifer Osborn is a 69 y.o. right-handed African American female with hypertension, hyperlipidemia, and diabetes returning to the clinic for follow-up on spinal cord abnormality.  The patient was accompanied to the clinic by self.  History of present illness: Starting around 2017, she began having numbness involving the entire left arm, which has been constant since onset.  No identifiable triggers or alleviating factors.  Recently, she has been having tight sensation over the upper arm.  She has difficulty with fine motor tasks because she drops things frequently from the numbness in her fingers.  In 2018, she developed mild tingling over the fingertips on the right. Prior evaluation has included electrodiagnostic testing which shows mild carpal tunnel syndrome bilaterally. 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    She denies any numbness/tingling of the legs, weakness of the arms or legs, vision changes, or difficulty swallowing/talking.  She occasionally has left leg cramps and episodic jerks.    UPDATE 01/27/2019:   At her prior visit, I recommended MRI brain and CSF testing to further evaluation her C2-3 cord abnormality, which was declined.  She has noticed mild increase in the numbness/tingling of the hands and mild weakness.   Repeat MRI cervical spine wwo contrast continues to show stable posterolateral cord T2 hyperintensity at C2-3, unchanged.    UPDATE 10/13/2019:  Since her last visit, she had MRI brain which showed scattered white matter changes, including deep, juxtacortical, and corpas callosum lesions.  She continues to have difficulty with fine motor movements in the left hand  No new neurological symptoms such as weakness or numbness/tingling of the right arm or legs.     Medications:    Current Outpatient Medications on File Prior to Visit  Medication Sig Dispense Refill  . aspirin 81 MG tablet Take 81 mg by mouth daily.    . carvedilol (COREG) 12.5 MG tablet Take 12.5 mg by mouth 2 (two) times daily with a meal.    . clobetasol cream (TEMOVATE) 0.05 %   1  . ergocalciferol (VITAMIN D2) 50000 UNITS capsule Take 50,000 Units by mouth every 30 (thirty) days.    . hydrochlorothiazide (HYDRODIURIL) 25 MG tablet Take 25 mg by mouth daily.    Marland Kitchen losartan (COZAAR) 100 MG tablet Take 100 mg by mouth daily.    . metFORMIN (GLUCOPHAGE) 500 MG tablet Take 500 mg by mouth daily with breakfast.    . rosuvastatin (CRESTOR) 20 MG tablet      No current facility-administered medications on file prior to visit.    Allergies:  Allergies  Allergen Reactions  . Percocet [Oxycodone-Acetaminophen]   . Latex Rash    Vital Signs:  BP (!) 153/82   Pulse 62   Ht 5\' 4"  (1.626 m)   Wt 177 lb 3.2 oz (80.4 kg)   SpO2 97%   BMI 30.42 kg/m   Neurological Exam: MENTAL STATUS including orientation to time, place, person, recent and remote memory, attention span and concentration, language, and fund of knowledge is normal.  Speech is not dysarthric.  CRANIAL NERVES: No visual field defects.  Pupils equal round and reactive to light.  Normal conjugate, extra-ocular eye movements in all directions of gaze.  No ptosis.    MOTOR:  Motor strength is 5/5 in all extremities, except  trace mild weakness with finger abductors and ABP on the left (5-/5). No pronator drift.  Tone is normal.   MSRs:  Right                                                                 Left brachioradialis 2+  brachioradialis 2+  biceps 2+  biceps 2+  triceps 2+  triceps 2+  patellar 3+  patellar 3+  ankle jerk 2+  ankle jerk 2+  plantar response down  plantar response down   SENSORY:  Temperature reduced over the hands, intact vibration in the legs.  COORDINATION/GAIT:  Normal finger-to- nose-finger. Finger  tapping is slowed on the left.  Gait narrow based and stable.   Data: MRI cervical spine dated 07/10/2017.  There is a focal area of increased T2 signal abnormality in the left hemicord at the C2-3 level.  Old demyelinating plaque may appear this way and further testing is warranted.    MRI cervical spine wwo contrast 12/23/2018: 1. Similar appearance of left posterolateral cord T2 signal hyperintensity at C2-3 level. There is no associated enhancement. This most likely reflects demyelinating disease given the asymmetric appearance. Cord ischemia is still considered, but less likely given the symmetry. 2. No new lesions or significant other cord signal abnormality or enhancement. 3. Multilevel degenerative changes of the cervical spine are otherwise stable.  MRI brain wwo contrast 09/29/2019: 1. No acute intracranial abnormality. 2. Scattered foci of T2 hyperintensity within the white matter of the cerebral hemispheres including deep, juxtacortical white matter and corpus callosum. Findings are nonspecific. Differential diagnosis includes microvascular ischemic changes, demyelinating disease, vasculitis and other infectious/inflammatory processes.  Lab Results  Component Value Date   VITAMINB12 501 08/01/2017   Lab Results  Component Value Date   ESRSEDRATE 12 08/01/2017   MRI brain wwo contrast 09/26/2019: 1. No acute intracranial abnormality. 2. Scattered foci of T2 hyperintensity within the white matter of the cerebral hemispheres including deep, juxtacortical white matter and corpus callosum. Findings are nonspecific. Differential diagnosis includes microvascular ischemic changes, demyelinating disease, vasculitis and other infectious/inflammatory processes.  IMPRESSION/PLAN: Multiple sclerosis - new diagnosis.  Diagnosis based on findings on MRI brian and cervical spine which shows scattered white matter changes involving the deep, juxtacortical, corpus callosum, and cervical cord  (C2-3) lesions. Imaging personally viewed with patient. Clinically, she has numbness in the hands, worse on the left with associated difficulty with dexterity on the left.  Patient counseled on the pathophysiology, management options, and prognosis. I discussed the role of disease-modifying therapies in multiple sclerosis, which she will consider.  Specifically, we discussed Mayzent, Tecfidera, and Vumerity.  I also mentioned that if her disease course behaves in an atypical manner, CSF testing may be indicated. I will repeat MRI cervical spine wwo contrast for annual surveillance Follow-up after testing   Thank you for allowing me to participate in patient's care.  If I can answer any additional questions, I would be pleased to do so.    Sincerely,    Brigetta Beckstrom K. Allena Katz, DO

## 2019-10-14 ENCOUNTER — Telehealth: Payer: Self-pay

## 2019-10-14 NOTE — Telephone Encounter (Signed)
Spoke with pt and gave her appointment info for MRI cervical spine @ [facility] 01/14/2020 arrive @ 9:30 for 10:00 scan, she verbalized understanding.

## 2020-01-14 ENCOUNTER — Other Ambulatory Visit: Payer: Self-pay

## 2020-01-14 ENCOUNTER — Ambulatory Visit (HOSPITAL_COMMUNITY)
Admission: RE | Admit: 2020-01-14 | Discharge: 2020-01-14 | Disposition: A | Discharge status: Home / Self Care | Payer: Medicare Other | Source: Ambulatory Visit | Attending: Neurology | Admitting: Neurology

## 2020-01-14 DIAGNOSIS — G35 Multiple sclerosis: Secondary | ICD-10-CM | POA: Diagnosis present

## 2020-01-14 DIAGNOSIS — G959 Disease of spinal cord, unspecified: Secondary | ICD-10-CM | POA: Diagnosis present

## 2020-01-14 MED ORDER — GADOBUTROL 1 MMOL/ML IV SOLN
8.0000 mL | Freq: Once | INTRAVENOUS | Status: AC | PRN
  Administered 2020-01-14: 8 mL via INTRAVENOUS

## 2020-01-15 ENCOUNTER — Telehealth: Payer: Self-pay

## 2020-01-15 NOTE — Telephone Encounter (Signed)
-----   Message from Glendale Chard, DO sent at 01/14/2020  4:07 PM EDT ----- Please inform patient that her MRI is stable, with no new changes. I will review with her at her follow-up next month.

## 2020-01-15 NOTE — Telephone Encounter (Signed)
Called patient and informed her of results. Patient verbalized understanding. 

## 2020-02-13 ENCOUNTER — Ambulatory Visit (INDEPENDENT_AMBULATORY_CARE_PROVIDER_SITE_OTHER): Payer: Medicare Other | Admitting: Neurology

## 2020-02-13 ENCOUNTER — Encounter: Payer: Self-pay | Admitting: Neurology

## 2020-02-13 ENCOUNTER — Other Ambulatory Visit: Payer: Self-pay

## 2020-02-13 VITALS — BP 160/68 | HR 65 | Ht 64.0 in | Wt 175.6 lb

## 2020-02-13 DIAGNOSIS — G35 Multiple sclerosis: Secondary | ICD-10-CM | POA: Diagnosis not present

## 2020-02-13 NOTE — Patient Instructions (Signed)
Return to clinic in 4 months.

## 2020-02-13 NOTE — Progress Notes (Signed)
Follow-up Visit   Date: 02/13/20    Amaurie Osborn MRN: [id number] DOB: 19-Nov-1950   Interim History: Jennifer Osborn is a 69 y.o. right-handed African American female with hypertension, hyperlipidemia, and diabetes returning to the clinic for follow-up on multiple sclerosis.  The patient was accompanied to the clinic by husband.  History of present illness: Starting around 2017, she began having numbness involving the entire left arm, which has been constant since onset.  No identifiable triggers or alleviating factors.  Recently, she has been having tight sensation over the upper arm.  She has difficulty with fine motor tasks because she drops things frequently from the numbness in her fingers.  In 2018, she developed mild tingling over the fingertips on the right. Prior evaluation has included electrodiagnostic testing which shows mild carpal tunnel syndrome bilaterally. 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    She denies any numbness/tingling of the legs, weakness of the arms or legs, vision changes, or difficulty swallowing/talking.  She occasionally has left leg cramps and episodic jerks.    UPDATE 01/27/2019:   At her prior visit, I recommended MRI brain and CSF testing to further evaluation her C2-3 cord abnormality, which was declined.  She has noticed mild increase in the numbness/tingling of the hands and mild weakness.   Repeat MRI cervical spine wwo contrast continues to show stable posterolateral cord T2 hyperintensity at C2-3, unchanged.    UPDATE 10/13/2019:  Since her last visit, she had MRI brain which showed scattered white matter changes, including deep, juxtacortical, and corpas callosum lesions. Diagnosis of multiple sclerosis was made.  UPDATE 02/13/2020:  She is here for follow-up with her husband.  She denies any new complaints and continues to have numbness in the hands, worse on  the left, as well as reduced dexterity in the left hand.  Overall, unchanged. She denies any new complaints.  MRI cervical spine shows stable cervical cord lesion and spondylosis.  No new areas of abnormality.  She reviewed disease modifying therapies that I discussed and would like to hold off on starting anything for now.   Medications:  Current Outpatient Medications on File Prior to Visit  Medication Sig Dispense Refill  . aspirin 81 MG tablet Take 81 mg by mouth daily.    . carvedilol (COREG) 12.5 MG tablet Take 12.5 mg by mouth 2 (two) times daily with a meal.    . clobetasol cream (TEMOVATE) 0.05 %   1  . ergocalciferol (VITAMIN D2) 50000 UNITS capsule Take 50,000 Units by mouth every 30 (thirty) days.    . hydrochlorothiazide (HYDRODIURIL) 25 MG tablet Take 25 mg by mouth daily.    Marland Kitchen losartan (COZAAR) 100 MG tablet Take 100 mg by mouth daily.    . metFORMIN (GLUCOPHAGE) 500 MG tablet Take 500 mg by mouth daily with breakfast.    . rosuvastatin (CRESTOR) 20 MG tablet      No current facility-administered medications on file prior to visit.    Allergies:  Allergies  Allergen Reactions  . Percocet [Oxycodone-Acetaminophen]   . Latex Rash    Vital Signs:  BP (!) 160/68   Pulse 65   Ht 5\' 4"  (1.626 m)   Wt 175 lb 9.6 oz (79.7 kg)   SpO2 100%   BMI 30.14 kg/m   Neurological Exam: MENTAL STATUS including orientation to time, place, person, recent and remote memory, attention span  and concentration, language, and fund of knowledge is normal.  Speech is not dysarthric.  CRANIAL NERVES: No visual field defects.  Pupils equal round and reactive to light.  Normal conjugate, extra-ocular eye movements in all directions of gaze.  No ptosis.    MOTOR:  Motor strength is 5/5 in all extremities, except trace mild weakness with finger abductors and ABP on the left (5-/5). No pronator drift.  Tone is normal.   MSRs:  Right                                                                  Left brachioradialis 2+  brachioradialis 2+  biceps 2+  biceps 2+  triceps 2+  triceps 2+  patellar 3+  patellar 3+  ankle jerk 2+  ankle jerk 2+  plantar response down  plantar response down   SENSORY:  Temperature reduced over the hands, intact vibration in the legs.  COORDINATION/GAIT:  Normal finger-to- nose-finger. Finger tapping is slowed on the left.  Gait narrow based and stable.   Data: MRI cervical spine dated 07/10/2017.  There is a focal area of increased T2 signal abnormality in the left hemicord at the C2-3 level.  Old demyelinating plaque may appear this way and further testing is warranted.    MRI cervical spine wwo contrast 12/23/2018: 1. Similar appearance of left posterolateral cord T2 signal hyperintensity at C2-3 level. There is no associated enhancement. This most likely reflects demyelinating disease given the asymmetric appearance. Cord ischemia is still considered, but less likely given the symmetry. 2. No new lesions or significant other cord signal abnormality or enhancement. 3. Multilevel degenerative changes of the cervical spine are otherwise stable.  MRI brain wwo contrast 09/29/2019: 1. No acute intracranial abnormality. 2. Scattered foci of T2 hyperintensity within the white matter of the cerebral hemispheres including deep, juxtacortical white matter and corpus callosum. Findings are nonspecific. Differential diagnosis includes microvascular ischemic changes, demyelinating disease, vasculitis and other infectious/inflammatory processes.  Lab Results  Component Value Date   VITAMINB12 501 08/01/2017   Lab Results  Component Value Date   ESRSEDRATE 12 08/01/2017   MRI brain wwo contrast 09/26/2019: 1. No acute intracranial abnormality. 2. Scattered foci of T2 hyperintensity within the white matter of the cerebral hemispheres including deep, juxtacortical white matter and corpus callosum. Findings are nonspecific. Differential diagnosis includes  microvascular ischemic changes, demyelinating disease, vasculitis and other infectious/inflammatory processes.  MRI cervical spine wo contrast 01/14/2020: 1. Stable appearance of the dorsal cord at the C2-3 level centrally and towards the left. Mild volume loss. No evidence of mass effect or contrast enhancement. This is consistent with a old insult. Given the clinical diagnosis of multiple sclerosis, this is most consistent with an MS plaque. No sign of compressive myelopathy at this level. Differential diagnosis does include old vascular insult, this is not felt likely. 2. 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 Apparent fusion at C4-5 and possibly at C5-6. Bilateral bony foraminal narrowing at those levels. Bilateral bony foraminal narrowing at C6-7 right worse than left. 3. C7-T1 facet arthritis with 1 mm of anterolisthesis. Mild bilateral foraminal narrowing. 4. Upper thoracic spondylosis with bilateral foraminal stenosis at T1-2 and T2-3.   IMPRESSION/PLAN: Relapsing remitting multiple sclerosis (diagnosed 2021), symptoms onset prior  to this.  Diagnosed based on imaging which shows scattered white matter changes involving the deep, juxtacortical, corpus callosum, and cervical cord (C2-3) lesions.  Clinically, she continues to have numbness in the hands, worse on the left, where there is also reduced dexterity. Overall, symptoms are unchanged and stable. We discussed the role of disease modifying therapy and options, which she will consider.  At this time, she does not wish to start any medications. In the meantime, continue vitamin D 09628 IU monthly, which she was already doing.   Total time spent reviewing records, interview, history/exam, documentation, and coordination of care on day of encounter:  20 min    Thank you for allowing me to participate in patient's care.  If I can answer any additional questions, I would be pleased to do so.     Sincerely,    Arzell Mcgeehan K. Allena Katz, DO

## 2020-06-17 ENCOUNTER — Ambulatory Visit (INDEPENDENT_AMBULATORY_CARE_PROVIDER_SITE_OTHER): Payer: Medicare Other | Admitting: Neurology

## 2020-06-17 ENCOUNTER — Encounter: Payer: Self-pay | Admitting: Neurology

## 2020-06-17 ENCOUNTER — Other Ambulatory Visit: Payer: Self-pay

## 2020-06-17 VITALS — BP 180/78 | HR 73 | Ht 64.0 in | Wt 175.4 lb

## 2020-06-17 DIAGNOSIS — G35 Multiple sclerosis: Secondary | ICD-10-CM

## 2020-06-17 NOTE — Patient Instructions (Signed)
Return to clinic in 6 months.

## 2020-06-17 NOTE — Progress Notes (Signed)
Follow-up Visit   Date: 06/17/20    Jennifer Osborn MRN: [id number] DOB: 26-Jan-1951   Interim History: Jennifer Osborn is a 70 y.o. right-handed African American female with hypertension, hyperlipidemia, and diabetes returning to the clinic for follow-up on multiple sclerosis.  The patient was accompanied to the clinic by husband.  History of present illness: Starting around 2017, she began having numbness involving the entire left arm, which has been constant since onset.  No identifiable triggers or alleviating factors.  Recently, she has been having tight sensation over the upper arm.  She has difficulty with fine motor tasks because she drops things frequently from the numbness in her fingers.  In 2018, she developed mild tingling over the fingertips on the right. Prior evaluation has included electrodiagnostic testing which shows mild carpal tunnel syndrome bilaterally. 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    She denies any numbness/tingling of the legs, weakness of the arms or legs, vision changes, or difficulty swallowing/talking.  She occasionally has left leg cramps and episodic jerks.    UPDATE 01/27/2019:   At her prior visit, I recommended MRI brain and CSF testing to further evaluation her C2-3 cord abnormality, which was declined.  She has noticed mild increase in the numbness/tingling of the hands and mild weakness.   Repeat MRI cervical spine wwo contrast continues to show stable posterolateral cord T2 hyperintensity at C2-3, unchanged.    UPDATE 10/13/2019:  Since her last visit, she had MRI brain which showed scattered white matter changes, including deep, juxtacortical, and corpas callosum lesions. Diagnosis of multiple sclerosis was made.  UPDATE 02/13/2020:  She is here for follow-up with her husband.  She denies any new complaints and continues to have numbness in the hands, worse on  the left, as well as reduced dexterity in the left hand.  Overall, unchanged. She denies any new complaints.  MRI cervical spine shows stable cervical cord lesion and spondylosis.  No new areas of abnormality.  She reviewed disease modifying therapies that I discussed and would like to hold off on starting anything for now.   UPDATE 06/17/2020:  She is here for follow-up visit.  She does not have any new neurological complaints.  Her left hand remains numbness/tingling and she feels that it is weak at times, making fine motor tasks difficult. Overall, she is feeling well and she does not wish to start DMTs.   Medications:  Current Outpatient Medications on File Prior to Visit  Medication Sig Dispense Refill  . aspirin 81 MG tablet Take 81 mg by mouth daily.    . carvedilol (COREG) 12.5 MG tablet Take 12.5 mg by mouth 2 (two) times daily with a meal.    . clobetasol cream (TEMOVATE) 0.05 %   1  . ergocalciferol (VITAMIN D2) 50000 UNITS capsule Take 50,000 Units by mouth every 30 (thirty) days.    . hydrochlorothiazide (HYDRODIURIL) 25 MG tablet Take 25 mg by mouth daily.    Marland Kitchen losartan (COZAAR) 100 MG tablet Take 100 mg by mouth daily.    . metFORMIN (GLUCOPHAGE) 500 MG tablet Take 500 mg by mouth daily with breakfast.    . rosuvastatin (CRESTOR) 20 MG tablet      No current facility-administered medications on file prior to visit.    Allergies:  Allergies  Allergen Reactions  . Percocet [Oxycodone-Acetaminophen]   . Latex Rash  Vital Signs:  BP (!) 180/78   Pulse 73   Ht 5\' 4"  (1.626 m)   Wt 175 lb 6.4 oz (79.6 kg)   SpO2 99%   BMI 30.11 kg/m   Neurological Exam: MENTAL STATUS including orientation to time, place, person, recent and remote memory, attention span and concentration, language, and fund of knowledge is normal.  Speech is not dysarthric.  CRANIAL NERVES: No visual field defects.  Pupils equal round and reactive to light.  Normal conjugate, extra-ocular eye movements  in all directions of gaze.  No ptosis.  Face is symmetric.   MOTOR:  Motor strength is 5/5 in all extremities, except trace mild weakness with finger abductors and ABP on the left (5-/5). No pronator drift.  Tone is normal.   MSRs:  Right                                                                 Left brachioradialis 2+  brachioradialis 2+  biceps 2+  biceps 2+  triceps 2+  triceps 2+  patellar 3+  patellar 3+  ankle jerk 2+  ankle jerk 2+  plantar response down  plantar response down   SENSORY:  Temperature reduced over the hands, intact vibration in the legs.  COORDINATION/GAIT:  Normal finger-to- nose-finger. Finger tapping is slowed on the left.  Gait narrow based and stable. Stressed and tandem gait intact.   Data: MRI cervical spine dated 07/10/2017.  There is a focal area of increased T2 signal abnormality in the left hemicord at the C2-3 level.  Old demyelinating plaque may appear this way and further testing is warranted.    MRI cervical spine wwo contrast 12/23/2018: 1. Similar appearance of left posterolateral cord T2 signal hyperintensity at C2-3 level. There is no associated enhancement. This most likely reflects demyelinating disease given the asymmetric appearance. Cord ischemia is still considered, but less likely given the symmetry. 2. No new lesions or significant other cord signal abnormality or enhancement. 3. Multilevel degenerative changes of the cervical spine are otherwise stable.  MRI brain wwo contrast 09/29/2019: 1. No acute intracranial abnormality. 2. Scattered foci of T2 hyperintensity within the white matter of the cerebral hemispheres including deep, juxtacortical white matter and corpus callosum. Findings are nonspecific. Differential diagnosis includes microvascular ischemic changes, demyelinating disease, vasculitis and other infectious/inflammatory processes.  Lab Results  Component Value Date   VITAMINB12 501 08/01/2017   Lab Results   Component Value Date   ESRSEDRATE 12 08/01/2017   MRI brain wwo contrast 09/26/2019: 1. No acute intracranial abnormality. 2. Scattered foci of T2 hyperintensity within the white matter of the cerebral hemispheres including deep, juxtacortical white matter and corpus callosum. Findings are nonspecific. Differential diagnosis includes microvascular ischemic changes, demyelinating disease, vasculitis and other infectious/inflammatory processes.  MRI cervical spine wo contrast 01/14/2020: 1. Stable appearance of the dorsal cord at the C2-3 level centrally and towards the left. Mild volume loss. No evidence of mass effect or contrast enhancement. This is consistent with a old insult. Given the clinical diagnosis of multiple sclerosis, this is most consistent with an MS plaque. No sign of compressive myelopathy at this level. Differential diagnosis does include old vascular insult, this is not felt likely. 2. 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 Apparent  fusion at C4-5 and possibly at C5-6. Bilateral bony foraminal narrowing at those levels. Bilateral bony foraminal narrowing at C6-7 right worse than left. 3. C7-T1 facet arthritis with 1 mm of anterolisthesis. Mild bilateral foraminal narrowing. 4. Upper thoracic spondylosis with bilateral foraminal stenosis at T1-2 and T2-3.   IMPRESSION/PLAN: Relapsing remitting multiple sclerosis (diagnosed 2021), symptoms onset prior to this.  Diagnosed based on imaging which shows scattered white matter changes involving the deep, juxtacortical, corpus callosum, and cervical cord (C2-3) lesions.  Clinically, she remains unchanged with bilateral hand numbness, worse on the left where there is reduced dexterity.  Again, I discussed the role of disease modifying therapies and she would like to continue to monitor off medications.  She does express interest in started therapy, if there is new abnormalities on imaging or new  symptoms.  Return to clinic in 6 months.    Thank you for allowing me to participate in patient's care.  If I can answer any additional questions, I would be pleased to do so.    Sincerely,    Teneka Malmberg K. Allena Katz, DO

## 2020-10-17 LAB — COLOGUARD: COLOGUARD: NEGATIVE

## 2020-12-17 ENCOUNTER — Other Ambulatory Visit: Payer: Self-pay

## 2020-12-17 ENCOUNTER — Ambulatory Visit (INDEPENDENT_AMBULATORY_CARE_PROVIDER_SITE_OTHER): Payer: Medicare Other | Admitting: Neurology

## 2020-12-17 ENCOUNTER — Encounter: Payer: Self-pay | Admitting: Neurology

## 2020-12-17 VITALS — BP 157/83 | HR 77 | Ht 64.0 in | Wt 176.0 lb

## 2020-12-17 DIAGNOSIS — G35 Multiple sclerosis: Secondary | ICD-10-CM | POA: Diagnosis not present

## 2020-12-17 NOTE — Progress Notes (Signed)
Follow-up Visit   Date: 12/17/20    Jennifer Osborn MRN: [id number] DOB: Jun 13, 1950   Interim History: Jennifer Osborn is a 70 y.o. right-handed African American female with hypertension, hyperlipidemia, and diabetes returning to the clinic for follow-up on multiple sclerosis.  The patient was accompanied to the clinic by husband.  History of present illness: Starting around 2017, she began having numbness involving the entire left arm, which has been constant since onset.  No identifiable triggers or alleviating factors.  Recently, she has been having tight sensation over the upper arm.  She has difficulty with fine motor tasks because she drops things frequently from the numbness in her fingers.  In 2018, she developed mild tingling over the fingertips on the right. Prior evaluation has included electrodiagnostic testing which shows mild carpal tunnel syndrome bilaterally. 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     She denies any numbness/tingling of the legs, weakness of the arms or legs, vision changes, or difficulty swallowing/talking.  She occasionally has left leg cramps and episodic jerks.    UPDATE 01/27/2019:   At her prior visit, I recommended MRI brain and CSF testing to further evaluation her C2-3 cord abnormality, which was declined.  She has noticed mild increase in the numbness/tingling of the hands and mild weakness.   Repeat MRI cervical spine wwo contrast continues to show stable posterolateral cord T2 hyperintensity at C2-3, unchanged.    UPDATE 10/13/2019:  Since her last visit, she had MRI brain which showed scattered white matter changes, including deep, juxtacortical, and corpas callosum lesions. Diagnosis of multiple sclerosis was made.  UPDATE 02/13/2020:  She is here for follow-up with her husband.  She denies any new complaints and continues to have numbness in the hands, worse on  the left, as well as reduced dexterity in the left hand.  Overall, unchanged. She denies any new complaints.  MRI cervical spine shows stable cervical cord lesion and spondylosis.  No new areas of abnormality.  She reviewed disease modifying therapies that I discussed and would like to hold off on starting anything for now.   UPDATE 06/17/2020:  She is here for follow-up visit.  She does not have any new neurological complaints.  Her left hand remains numbness/tingling and she feels that it is weak at times, making fine motor tasks difficult. Overall, she is feeling well and she does not wish to start DMTs.   UPDATE 12/17/2020:  She is here for follow-up.  Sometimes, she has achy pain in her bones.  Sometimes she has extreme fatigue.  Her PCP has checked TSH and vitamin B12 which is normal.  She denies any new neurological complaints.  She continues to have problems with dexterty and numbness in the left hand.     Medications:  Current Outpatient Medications on File Prior to Visit  Medication Sig Dispense Refill   aspirin 81 MG tablet Take 81 mg by mouth daily.     carvedilol (COREG) 12.5 MG tablet Take 12.5 mg by mouth 2 (two) times daily with a meal.     clobetasol cream (TEMOVATE) 0.05 %   1   ergocalciferol (VITAMIN D2) 50000 UNITS capsule Take 50,000 Units by mouth every 30 (thirty) days.     hydrochlorothiazide (HYDRODIURIL) 25 MG tablet Take 25 mg by mouth daily.     losartan (COZAAR) 100 MG tablet Take 100 mg by mouth daily.  metFORMIN (GLUCOPHAGE) 500 MG tablet Take 500 mg by mouth daily with breakfast.     rosuvastatin (CRESTOR) 20 MG tablet      No current facility-administered medications on file prior to visit.    Allergies:  Allergies  Allergen Reactions   Percocet [Oxycodone-Acetaminophen]    Latex Rash    Vital Signs:  BP (!) 157/83   Pulse 77   Ht 5\' 4"  (1.626 m)   Wt 176 lb (79.8 kg)   SpO2 98%   BMI 30.21 kg/m   Neurological Exam: MENTAL STATUS including  orientation to time, place, person, recent and remote memory, attention span and concentration, language, and fund of knowledge is normal.  Speech is not dysarthric.  CRANIAL NERVES: No visual field defects.  Pupils equal round and reactive to light.  Normal conjugate, extra-ocular eye movements in all directions of gaze.  No ptosis.  Face is symmetric.   MOTOR:  Motor strength is 5/5 in all extremities, except trace mild weakness with finger abductors and ABP on the left (5-/5). No pronator drift.  Tone is normal.   MSRs:  Right                                                                 Left brachioradialis 2+  brachioradialis 2+  biceps 2+  biceps 2+  triceps 2+  triceps 2+  patellar 3+  patellar 3+  ankle jerk 2+  ankle jerk 2+  plantar response down  plantar response down   SENSORY:  Temperature reduced over the hands, vibration reduced in the left hand as compared to the right, intact vibration in the legs.  COORDINATION/GAIT:  Normal finger-to- nose-finger. Finger tapping is slowed on the left.  Gait narrow based and stable.   Data: MRI cervical spine dated 07/10/2017.  There is a focal area of increased T2 signal abnormality in the left hemicord at the C2-3 level.  Old demyelinating plaque may appear this way and further testing is warranted.    MRI cervical spine wwo contrast 12/23/2018: 1. Similar appearance of left posterolateral cord T2 signal hyperintensity at C2-3 level. There is no associated enhancement. This most likely reflects demyelinating disease given the asymmetric appearance. Cord ischemia is still considered, but less likely given the symmetry. 2. No new lesions or significant other cord signal abnormality or enhancement. 3. Multilevel degenerative changes of the cervical spine are otherwise stable.  MRI brain wwo contrast 09/29/2019: 1. No acute intracranial abnormality. 2. Scattered foci of T2 hyperintensity within the white matter of the cerebral  hemispheres including deep, juxtacortical white matter and corpus callosum. Findings are nonspecific. Differential diagnosis includes microvascular ischemic changes, demyelinating disease, vasculitis and other infectious/inflammatory processes.  Lab Results  Component Value Date   VITAMINB12 501 08/01/2017   Lab Results  Component Value Date   ESRSEDRATE 12 08/01/2017   MRI brain wwo contrast 09/26/2019: 1. No acute intracranial abnormality. 2. Scattered foci of T2 hyperintensity within the white matter of the cerebral hemispheres including deep, juxtacortical white matter and corpus callosum. Findings are nonspecific. Differential diagnosis includes microvascular ischemic changes, demyelinating disease, vasculitis and other infectious/inflammatory processes.  MRI cervical spine wo contrast 01/14/2020: 1. Stable appearance of the dorsal cord at the C2-3 level centrally and towards the left. Mild  volume loss. No evidence of mass effect or contrast enhancement. This is consistent with a old insult. Given the clinical diagnosis of multiple sclerosis, this is most consistent with an MS plaque. No sign of compressive myelopathy at this level. Differential diagnosis does include old vascular insult, this is not felt likely. 2. 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 Apparent fusion at C4-5 and possibly at C5-6. Bilateral bony foraminal narrowing at those levels. Bilateral bony foraminal narrowing at C6-7 right worse than left. 3. C7-T1 facet arthritis with 1 mm of anterolisthesis. Mild bilateral foraminal narrowing. 4. Upper thoracic spondylosis with bilateral foraminal stenosis at T1-2 and T2-3.    IMPRESSION/PLAN: Relapsing remitting multiple sclerosis (diagnosed 2021) based on imaging which shows scattered white matter changes involving the deep white matter, juxtacortical, corpus callosum, and cervical cord (C2-3) lesions.  She has elected not to be on disease modifying  therapy, but would consider starting, if there are any new lesions.  Clinically, she continues to have bilateral hand numbness, worse on the left.  Exam is stable.  She is due for surveillance imaging with MRI brain and cervical spine wwo contrast.  Further recommendations pending results.  Return to clinic in 1 year, or sooner as needed.   Thank you for allowing me to participate in patient's care.  If I can answer any additional questions, I would be pleased to do so.    Sincerely,    Evalette Montrose K. Allena Katz, DO

## 2020-12-17 NOTE — Patient Instructions (Signed)
MRI brain and cervical spine wwo contrast  Return to clinic 1 year

## 2020-12-30 ENCOUNTER — Other Ambulatory Visit: Payer: Self-pay

## 2020-12-30 ENCOUNTER — Ambulatory Visit (HOSPITAL_COMMUNITY)
Admission: RE | Admit: 2020-12-30 | Discharge: 2020-12-30 | Disposition: A | Discharge status: Home / Self Care | Payer: Medicare Other | Source: Ambulatory Visit | Attending: Neurology | Admitting: Neurology

## 2020-12-30 DIAGNOSIS — G35 Multiple sclerosis: Secondary | ICD-10-CM | POA: Insufficient documentation

## 2020-12-30 MED ORDER — GADOBUTROL 1 MMOL/ML IV SOLN
7.5000 mL | Freq: Once | INTRAVENOUS | Status: AC | PRN
  Administered 2020-12-30: 7.5 mL via INTRAVENOUS

## 2021-01-06 NOTE — Progress Notes (Signed)
Pt husband Bruce advised of Patients MRI results. And advise to call if patient has any neck pain.

## 2021-12-09 ENCOUNTER — Encounter: Payer: Self-pay | Admitting: Neurology

## 2021-12-09 ENCOUNTER — Ambulatory Visit (INDEPENDENT_AMBULATORY_CARE_PROVIDER_SITE_OTHER): Payer: Medicare Other | Admitting: Neurology

## 2021-12-09 VITALS — BP 166/80 | HR 75 | Ht 64.0 in | Wt 174.0 lb

## 2021-12-09 DIAGNOSIS — M25512 Pain in left shoulder: Secondary | ICD-10-CM | POA: Diagnosis not present

## 2021-12-09 DIAGNOSIS — G35 Multiple sclerosis: Secondary | ICD-10-CM | POA: Diagnosis not present

## 2021-12-09 DIAGNOSIS — M25552 Pain in left hip: Secondary | ICD-10-CM

## 2021-12-09 DIAGNOSIS — G8929 Other chronic pain: Secondary | ICD-10-CM

## 2021-12-09 NOTE — Patient Instructions (Addendum)
MRI brain and cervical spine  Refer to [facility] - Dr. Arther Abbott - for left shoulder and hip pain.  You can request that your primary care doctor check vitamin B12 and TSH  Return to clinic in 1 year

## 2021-12-09 NOTE — Progress Notes (Signed)
Follow-up Visit   Date: 12/09/21    Jennifer Osborn MRN: [id number] DOB: [birth year]   Interim History: Jennifer Osborn is a 71 y.o. right-handed African American female with hypertension, hyperlipidemia, and diabetes returning to the clinic for follow-up on multiple sclerosis and new complaints of left shoulder and hip/leg pain.  The patient was accompanied to the clinic by husband.  History of present illness: Starting around 2017, she began having numbness involving the entire left arm, which has been constant since onset.  No identifiable triggers or alleviating factors.  Recently, she has been having tight sensation over the upper arm.  She has difficulty with fine motor tasks because she drops things frequently from the numbness in her fingers.  In 2018, she developed mild tingling over the fingertips on the right. Prior evaluation has included electrodiagnostic testing which shows mild carpal tunnel syndrome bilaterally. 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     She denies any numbness/tingling of the legs, weakness of the arms or legs, vision changes, or difficulty swallowing/talking.  She occasionally has left leg cramps and episodic jerks.    UPDATE 01/27/2019:   At her prior visit, I recommended MRI brain and CSF testing to further evaluation her C2-3 cord abnormality, which was declined.  She has noticed mild increase in the numbness/tingling of the hands and mild weakness.   Repeat MRI cervical spine wwo contrast continues to show stable posterolateral cord T2 hyperintensity at C2-3, unchanged.    UPDATE 10/13/2019:  Since her last visit, she had MRI brain which showed scattered white matter changes, including deep, juxtacortical, and corpas callosum lesions. Diagnosis of multiple sclerosis was made.  UPDATE 02/13/2020:  She is here for follow-up with her husband.  She denies any new complaints and  continues to have numbness in the hands, worse on the left, as well as reduced dexterity in the left hand.  Overall, unchanged. She denies any new complaints.  MRI cervical spine shows stable cervical cord lesion and spondylosis.  No new areas of abnormality.  She reviewed disease modifying therapies that I discussed and would like to hold off on starting anything for now.   UPDATE 06/17/2020:  She is here for follow-up visit.  She does not have any new neurological complaints.  Her left hand remains numbness/tingling and she feels that it is weak at times, making fine motor tasks difficult. Overall, she is feeling well and she does not wish to start DMTs.   UPDATE 12/17/2020:  She is here for follow-up.  Sometimes, she has achy pain in her bones.  Sometimes she has extreme fatigue.  Her PCP has checked TSH and vitamin B12 which is normal.  She denies any new neurological complaints.  She continues to have problems with dexterty and numbness in the left hand.    UPDATE 12/09/2021:  She is here for follow-up visit.  MRI brain and cervical spine from 2022 shows stable disease consistent with multiple sclerosis.  She does not have any new neurological symptoms.  Numbness in the hands is stable.  Earlier this year, she began having left shoulder pain, worse with movement.  She also complains of dull pain in the left thigh and hip, again worse when she is walking or standing. She has not tried taking tylenol or NSAID.  No radicular pain. She continues to have extreme fatigue, especially after waking up and having breakfast.  Often, she takes a nap and then feels more refreshed.    Medications:  Current Outpatient Medications on File Prior to Visit  Medication Sig Dispense Refill   aspirin 81 MG tablet Take 81 mg by mouth daily.     carvedilol (COREG) 12.5 MG tablet Take 12.5 mg by mouth 2 (two) times daily with a meal.     clobetasol cream (TEMOVATE) 0.05 %   1   ergocalciferol (VITAMIN D2) 50000 UNITS capsule  Take 50,000 Units by mouth every 30 (thirty) days.     hydrochlorothiazide (HYDRODIURIL) 25 MG tablet Take 25 mg by mouth daily.     losartan (COZAAR) 100 MG tablet Take 100 mg by mouth daily.     metFORMIN (GLUCOPHAGE) 500 MG tablet Take 500 mg by mouth daily with breakfast.     rosuvastatin (CRESTOR) 20 MG tablet      No current facility-administered medications on file prior to visit.    Allergies:  Allergies  Allergen Reactions   Percocet [Oxycodone-Acetaminophen]    Latex Rash    Vital Signs:  BP (!) 166/80   Pulse 75   Ht 5\' 4"  (1.626 m)   Wt 174 lb (78.9 kg)   SpO2 99%   BMI 29.87 kg/m   Neurological Exam: MENTAL STATUS including orientation to time, place, person, recent and remote memory, attention span and concentration, language, and fund of knowledge is normal.  Speech is not dysarthric.  CRANIAL NERVES: No visual field defects.  Pupils equal round and reactive to light.  Normal conjugate, extra-ocular eye movements in all directions of gaze.  No ptosis.  Face is symmetric.   MOTOR:  Motor strength is 5/5 in all extremities, except trace mild weakness with finger abductors and ABP on the left (5-/5). No pronator drift.  Tone is normal.  Reduced left shoulder extension and abduction.    MSRs:  Right                                                                 Left brachioradialis 2+  brachioradialis 2+  biceps 2+  biceps 2+  triceps 2+  triceps 2+  patellar 3+  patellar 3+  ankle jerk 2+  ankle jerk 2+  plantar response down  plantar response down   SENSORY:  Vibration reduced in the left hand as compared to the right, intact vibration in the legs.  COORDINATION/GAIT:  Normal finger-to- nose-finger. Finger tapping is slowed on the left.  Gait appears antalgic, guarding the left leg, unassisted. .   Data: MRI cervical spine dated 07/10/2017.  There is a focal area of increased T2 signal abnormality in the left hemicord at the C2-3 level.  Old demyelinating  plaque may appear this way and further testing is warranted.    MRI cervical spine wwo contrast 12/23/2018: 1. Similar appearance of left posterolateral cord T2 signal hyperintensity at C2-3 level. There is no associated enhancement. This most likely reflects demyelinating disease given the asymmetric appearance. Cord ischemia is still considered, but less likely given the symmetry. 2. No new lesions or significant other cord signal abnormality or enhancement. 3. Multilevel degenerative changes of the cervical spine are otherwise stable.  MRI brain wwo contrast 09/29/2019: 1. No acute intracranial abnormality. 2. Scattered foci of T2 hyperintensity  within the white matter of the cerebral hemispheres including deep, juxtacortical white matter and corpus callosum. Findings are nonspecific. Differential diagnosis includes microvascular ischemic changes, demyelinating disease, vasculitis and other infectious/inflammatory processes.  Lab Results  Component Value Date   URKYHCWC37 628 08/01/2017   Lab Results  Component Value Date   ESRSEDRATE 12 08/01/2017   MRI brain wwo contrast 09/26/2019: 1. No acute intracranial abnormality. 2. Scattered foci of T2 hyperintensity within the white matter of the cerebral hemispheres including deep, juxtacortical white matter and corpus callosum. Findings are nonspecific. Differential diagnosis includes microvascular ischemic changes, demyelinating disease, vasculitis and other infectious/inflammatory processes.  MRI cervical spine wo contrast 01/14/2020: 1. Stable appearance of the dorsal cord at the C2-3 level centrally and towards the left. Mild volume loss. No evidence of mass effect or contrast enhancement. This is consistent with a old insult. Given the clinical diagnosis of multiple sclerosis, this is most consistent with an MS plaque. No sign of compressive myelopathy at this level. Differential diagnosis does include old vascular insult, this is not  felt likely. 2. 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 Apparent fusion at C4-5 and possibly at C5-6. Bilateral bony foraminal narrowing at those levels. Bilateral bony foraminal narrowing at C6-7 right worse than left. 3. C7-T1 facet arthritis with 1 mm of anterolisthesis. Mild bilateral foraminal narrowing. 4. Upper thoracic spondylosis with bilateral foraminal stenosis at T1-2 and T2-3.   MRI brain and cervical spine wwo contrast 12/31/2020: 1. Similar T2 hyperintensities within white matter in this patient with reported multiple sclerosis. No new or enhancing lesions. 2. No acute abnormality.   MRI cervical spine: 1. Similar short-segment T2 hyperintensity in the cord at C2, compatible with prior demyelination is patient for multiple sclerosis. No new or enhancing cord lesions. 2. Multilevel degenerative change with progressive severe bilateral foraminal stenosis C3-C4. Otherwise, similar advanced multilevel canal/foraminal stenosis, as detailed above.  IMPRESSION/PLAN: Relapsing remitting multiple sclerosis (2021).  Diagnosed based on imaging finding which shows scattered white matter changes involving the deep white matter, juxtacortical, corpus callosum, and cervical cord (C2-3) lesions.  She has opted not to start disease modifying therapy; fortunately, MRIs have been stable without progression of disease. Clinically, she has bilateral hand numbness, worse on the left.  I will check MRI brain and cervical spine.  If this is stable without progression on imaging and if she remains clinically stable, plan to check MRI every 2 years going forward.  Fatigue.  Recommend check B12 and TSH, she will have this done at PCP's office.  Chronic left shoulder and left hip/leg pain.  Both seem to be characteristic of musculoskeletal pathology.  I will have her follow-up with Dr. Aline Brochure, orthopaedic surgeon, whom she has seen in the past. In the meantime, conservative therapies  with ice/heat and NSAIDs recommended.   Return to clinic in 1 year   Thank you for allowing me to participate in patient's care.  If I can answer any additional questions, I would be pleased to do so.    Sincerely,    Malekai Markwood K. Posey Pronto, DO

## 2021-12-15 ENCOUNTER — Telehealth: Payer: Self-pay | Admitting: Neurology

## 2021-12-15 NOTE — Addendum Note (Signed)
Addended by: Armen Pickup A on: 12/15/2021 02:07 PM   Modules accepted: Orders

## 2021-12-15 NOTE — Telephone Encounter (Signed)
Called patient and left a detailed message per DPR that we have switched her MRI's location as requested and provided her with central scheduling's phone number [phone] to get scheduled. Left our office contact information if she had any questions or concerns.

## 2021-12-15 NOTE — Telephone Encounter (Signed)
Patient left VM waiting to speak with someone about having her MRI done at [facility]

## 2021-12-15 NOTE — Telephone Encounter (Signed)
Will switch orders and have to check if PA required.

## 2021-12-16 ENCOUNTER — Telehealth: Payer: Self-pay | Admitting: Neurology

## 2021-12-16 NOTE — Telephone Encounter (Signed)
See previous encounter. Patient has been switched and notified on her VM per DPR.

## 2021-12-16 NOTE — Telephone Encounter (Signed)
Called patient and left a message for a call back. Need to let patient know that I have already switched her orders and she may call central scheduling to schedule.

## 2021-12-16 NOTE — Telephone Encounter (Signed)
Patient is asking for her MRI orders to be switched to [facility].

## 2021-12-19 ENCOUNTER — Ambulatory Visit (INDEPENDENT_AMBULATORY_CARE_PROVIDER_SITE_OTHER): Payer: Medicare Other

## 2021-12-19 ENCOUNTER — Encounter: Payer: Self-pay | Admitting: Orthopedic Surgery

## 2021-12-19 ENCOUNTER — Ambulatory Visit (INDEPENDENT_AMBULATORY_CARE_PROVIDER_SITE_OTHER): Payer: Medicare Other | Admitting: Orthopedic Surgery

## 2021-12-19 ENCOUNTER — Ambulatory Visit: Payer: Medicare Other | Admitting: Neurology

## 2021-12-19 VITALS — BP 180/84 | HR 76 | Ht 64.0 in | Wt 173.0 lb

## 2021-12-19 DIAGNOSIS — M7552 Bursitis of left shoulder: Secondary | ICD-10-CM

## 2021-12-19 DIAGNOSIS — M25512 Pain in left shoulder: Secondary | ICD-10-CM | POA: Diagnosis not present

## 2021-12-19 MED ORDER — METHYLPREDNISOLONE ACETATE 40 MG/ML IJ SUSP
40.0000 mg | Freq: Once | INTRAMUSCULAR | Status: AC
  Administered 2021-12-19: 40 mg via INTRA_ARTICULAR

## 2021-12-19 NOTE — Patient Instructions (Addendum)
You have received an injection of steroids into the joint. 15% of patients will have increased pain within the 24 hours postinjection.   This is transient and will go away.   We recommend that you use ice packs on the injection site for 20 minutes every 2 hours and extra strength Tylenol 2 tablets every 8 as needed until the pain resolves.  If you continue to have pain after taking the Tylenol and using the ice please call the office for further instructions.   DR PATEL TO DO MRI LUMBAR

## 2021-12-19 NOTE — Progress Notes (Signed)
Chief Complaint  Patient presents with   Shoulder Pain    LEFT- two weeks hurts to raise it and go behind her seeing neurologist numbness in whole arm   71 year old female complains of painful forward elevation and numbness in her left upper extremity.  She has a history of multiple sclerosis  She will need further work-up which she is undergoing scheduled to have MRI of her cervical spine I believe soon.  She denies any trauma to the left shoulder  She is diabetic  BP (!) 180/84   Pulse 76   Ht 5\' 4"  (1.626 m)   Wt 173 lb (78.5 kg)   BMI 29.70 kg/m   General appearance normal  Cardiovascular normal pulse and perfusion  Musculoskeletal Left shoulder normal strength in abduction and flexion but she does have pain She has painful passive elevation above 120 degrees she has a positive impingement sign at 120 degrees  No instability  Tenderness is noted around the acromion  Imaging shows no fracture or dislocation please see report mild degenerative changes are seen nothing really significant  Encounter Diagnoses  Name Primary?   Acute pain of left shoulder Yes   Bursitis of left shoulder     Procedure note the subacromial injection shoulder left   Verbal consent was obtained to inject the  Left   Shoulder  Timeout was completed to confirm the injection site is a subacromial space of the  left  shoulder  Medication used Depo-Medrol 40 mg and lidocaine 1% 3 cc  Anesthesia was provided by ethyl chloride  The injection was performed in the left  posterior subacromial space. After pinning the skin with alcohol and anesthetized the skin with ethyl chloride the subacromial space was injected using a 20-gauge needle. There were no complications  Sterile dressing was applied.

## 2021-12-20 ENCOUNTER — Telehealth: Payer: Self-pay

## 2021-12-20 NOTE — Telephone Encounter (Signed)
Pt called back in returning Jennifer Osborn's call. She gave her cell number now that she will not be at home.

## 2021-12-20 NOTE — Telephone Encounter (Signed)
Called patient and left a message for a call back.  

## 2021-12-20 NOTE — Telephone Encounter (Signed)
-----   Message from Alda Berthold, DO sent at 12/20/2021  9:09 AM EDT ----- Please see if pt would like to do PT for her low back pain, if no improvement with PT then the next step would be MRI lumbar spine wo contrast.  Thanks.  ----- Message ----- From: Carole Civil, MD Sent: 12/19/2021  12:13 PM EDT To: Alda Berthold, DO  Dear colleagues  Thank you for allowing me to participate in the care of this patient.  However, she has some lower back pain with radicular symptoms and we do not cover that in our practice.  Can you please make appropriate referrals for her to get this checked out  Thank you Dr. Aline Brochure

## 2021-12-20 NOTE — Telephone Encounter (Signed)
Called patient and asked patient if she would like to do PT for her low back pain and if there is no improvement with PT then the next step would be MRI lumbar spine wo contrast.Patient stated she would like to think about this and will contact the office if she decides to move forward with PT.

## 2022-01-11 ENCOUNTER — Ambulatory Visit (HOSPITAL_COMMUNITY)
Admission: RE | Admit: 2022-01-11 | Discharge: 2022-01-11 | Disposition: A | Discharge status: Home / Self Care | Payer: Medicare Other | Source: Ambulatory Visit | Attending: Neurology | Admitting: Neurology

## 2022-01-11 DIAGNOSIS — G35 Multiple sclerosis: Secondary | ICD-10-CM

## 2022-01-11 MED ORDER — GADOBUTROL 1 MMOL/ML IV SOLN
8.0000 mL | Freq: Once | INTRAVENOUS | Status: AC | PRN
  Administered 2022-01-11: 8 mL via INTRAVENOUS

## 2022-05-11 ENCOUNTER — Encounter: Payer: Self-pay | Admitting: Radiology

## 2022-12-11 ENCOUNTER — Encounter: Payer: Self-pay | Admitting: Neurology

## 2022-12-11 ENCOUNTER — Ambulatory Visit (INDEPENDENT_AMBULATORY_CARE_PROVIDER_SITE_OTHER): Payer: Medicare Other | Admitting: Neurology

## 2022-12-11 VITALS — BP 152/68 | HR 76 | Ht 64.0 in | Wt 168.0 lb

## 2022-12-11 DIAGNOSIS — G35 Multiple sclerosis: Secondary | ICD-10-CM | POA: Diagnosis not present

## 2022-12-11 NOTE — Progress Notes (Signed)
Follow-up Visit   Date: 12/11/22    Jennifer Osborn MRN: [id number] DOB: 1950/08/01   Interim History: Jennifer Osborn is a 72 y.o. right-handed African American female with hypertension, hyperlipidemia, and diabetes returning to the clinic for follow-up on multiple sclerosis.  The patient was accompanied to the clinic by husband.  History of present illness: Starting around 2017, she began having numbness involving the entire left arm, which has been constant since onset.  No identifiable triggers or alleviating factors.  Recently, she has been having tight sensation over the upper arm.  She has difficulty with fine motor tasks because she drops things frequently from the numbness in her fingers.  In 2018, she developed mild tingling over the fingertips on the right. Prior evaluation has included electrodiagnostic testing which shows mild carpal tunnel syndrome bilaterally. 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     She denies any numbness/tingling of the legs, weakness of the arms or legs, vision changes, or difficulty swallowing/talking.  She occasionally has left leg cramps and episodic jerks.    UPDATE 01/27/2019:   At her prior visit, I recommended MRI brain and CSF testing to further evaluation her C2-3 cord abnormality, which was declined.  She has noticed mild increase in the numbness/tingling of the hands and mild weakness.   Repeat MRI cervical spine wwo contrast continues to show stable posterolateral cord T2 hyperintensity at C2-3, unchanged.    UPDATE 10/13/2019:  Since her last visit, she had MRI brain which showed scattered white matter changes, including deep, juxtacortical, and corpas callosum lesions. Diagnosis of multiple sclerosis was made.  UPDATE 02/13/2020:  She is here for follow-up with her husband.  She denies any new complaints and continues to have numbness in the hands, worse on  the left, as well as reduced dexterity in the left hand.  Overall, unchanged. She denies any new complaints.  MRI cervical spine shows stable cervical cord lesion and spondylosis.  No new areas of abnormality.  She reviewed disease modifying therapies that I discussed and would like to hold off on starting anything for now.   UPDATE 06/17/2020:  She is here for follow-up visit.  She does not have any new neurological complaints.  Her left hand remains numbness/tingling and she feels that it is weak at times, making fine motor tasks difficult. Overall, she is feeling well and she does not wish to start DMTs.   UPDATE 12/17/2020:  She is here for follow-up.  Sometimes, she has achy pain in her bones.  Sometimes she has extreme fatigue.  Her PCP has checked TSH and vitamin B12 which is normal.  She denies any new neurological complaints.  She continues to have problems with dexterty and numbness in the left hand.    UPDATE 12/09/2021:  She is here for follow-up visit.  MRI brain and cervical spine from 2022 shows stable disease consistent with multiple sclerosis.  She does not have any new neurological symptoms.  Numbness in the hands is stable.  Earlier this year, she began having left shoulder pain, worse with movement.  She also complains of dull pain in the left thigh and hip, again worse when she is walking or standing. She has not tried taking tylenol or NSAID.  No radicular pain. She continues to have extreme fatigue, especially after waking up and having breakfast.  Often, she takes a nap and then feels  more refreshed.   UPDATE 12/11/2022:  She is here for follow-up visit.  MRI brain and cervical spine from 2023 was stable without new findings.  She continues to have bilateral hand numbness and sometimes, worsening numbness in the left arm.  She has left shoulder bursitis and received injections, which has helped.  She continues to have left thigh dull achy pain.  She has been seeing PCP or this.     Medications:  Current Outpatient Medications on File Prior to Visit  Medication Sig Dispense Refill   aspirin 81 MG tablet Take 81 mg by mouth daily.     carvedilol (COREG) 12.5 MG tablet Take 12.5 mg by mouth 2 (two) times daily with a meal.     clobetasol cream (TEMOVATE) 0.05 %   1   ergocalciferol (VITAMIN D2) 50000 UNITS capsule Take 50,000 Units by mouth every 30 (thirty) days.     losartan (COZAAR) 100 MG tablet Take 100 mg by mouth daily.     metFORMIN (GLUCOPHAGE) 500 MG tablet Take 500 mg by mouth daily with breakfast.     propranolol (INDERAL) 10 MG tablet Take 10 mg by mouth daily.     rosuvastatin (CRESTOR) 20 MG tablet      torsemide (DEMADEX) 20 MG tablet Take 20 mg by mouth daily.     hydrochlorothiazide (HYDRODIURIL) 25 MG tablet Take 25 mg by mouth daily. (Patient not taking: Reported on 12/11/2022)     No current facility-administered medications on file prior to visit.    Allergies:  Allergies  Allergen Reactions   Percocet [Oxycodone-Acetaminophen]    Latex Rash    Vital Signs:  BP (!) 152/68   Pulse 76   Ht 5\' 4"  (1.626 m)   Wt 168 lb (76.2 kg)   SpO2 98%   BMI 28.84 kg/m   Neurological Exam: MENTAL STATUS including orientation to time, place, person, recent and remote memory, attention span and concentration, language, and fund of knowledge is normal.  Speech is not dysarthric.  CRANIAL NERVES: No visual field defects.  Pupils equal round and reactive to light.  Normal conjugate, extra-ocular eye movements in all directions of gaze.  No ptosis.  Face is symmetric.   MOTOR:  Motor strength is 5/5 in all extremities, except trace mild weakness with finger abductors and ABP on the left (5-/5). No pronator drift.  Tone is normal.   MSRs:  Right                                                                 Left brachioradialis 2+  brachioradialis 2+  biceps 2+  biceps 2+  triceps 2+  triceps 2+  patellar 3+  patellar 3+  ankle jerk 2+  ankle  jerk 2+  plantar response down  plantar response down   SENSORY:  Vibration reduced in the left hand as compared to the right, intact vibration in the legs.  COORDINATION/GAIT:  Normal finger-to- nose-finger. Finger tapping is slowed on the left.  Gait appears antalgic, unassisted.   Data: MRI cervical spine dated 07/10/2017.  There is a focal area of increased T2 signal abnormality in the left hemicord at the C2-3 level.  Old demyelinating plaque may appear this way and further testing is warranted.  MRI cervical spine wwo contrast 12/23/2018: 1. Similar appearance of left posterolateral cord T2 signal hyperintensity at C2-3 level. There is no associated enhancement. This most likely reflects demyelinating disease given the asymmetric appearance. Cord ischemia is still considered, but less likely given the symmetry. 2. No new lesions or significant other cord signal abnormality or enhancement. 3. Multilevel degenerative changes of the cervical spine are otherwise stable.  MRI brain wwo contrast 09/29/2019: 1. No acute intracranial abnormality. 2. Scattered foci of T2 hyperintensity within the white matter of the cerebral hemispheres including deep, juxtacortical white matter and corpus callosum. Findings are nonspecific. Differential diagnosis includes microvascular ischemic changes, demyelinating disease, vasculitis and other infectious/inflammatory processes.  Lab Results  Component Value Date   VITAMINB12 501 08/01/2017   Lab Results  Component Value Date   ESRSEDRATE 12 08/01/2017   MRI brain wwo contrast 09/26/2019: 1. No acute intracranial abnormality. 2. Scattered foci of T2 hyperintensity within the white matter of the cerebral hemispheres including deep, juxtacortical white matter and corpus callosum. Findings are nonspecific. Differential diagnosis includes microvascular ischemic changes, demyelinating disease, vasculitis and other infectious/inflammatory processes.  MRI  cervical spine wo contrast 01/14/2020: 1. Stable appearance of the dorsal cord at the C2-3 level centrally and towards the left. Mild volume loss. No evidence of mass effect or contrast enhancement. This is consistent with a old insult. Given the clinical diagnosis of multiple sclerosis, this is most consistent with an MS plaque. No sign of compressive myelopathy at this level. Differential diagnosis does include old vascular insult, this is not felt likely. 2. 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 Apparent fusion at C4-5 and possibly at C5-6. Bilateral bony foraminal narrowing at those levels. Bilateral bony foraminal narrowing at C6-7 right worse than left. 3. C7-T1 facet arthritis with 1 mm of anterolisthesis. Mild bilateral foraminal narrowing. 4. Upper thoracic spondylosis with bilateral foraminal stenosis at T1-2 and T2-3.   MRI brain and cervical spine wwo contrast 12/31/2020: 1. Similar T2 hyperintensities within white matter in this patient with reported multiple sclerosis. No new or enhancing lesions. 2. No acute abnormality.   MRI cervical spine: 1. Similar short-segment T2 hyperintensity in the cord at C2, compatible with prior demyelination is patient for multiple sclerosis. No new or enhancing cord lesions. 2. Multilevel degenerative change with progressive severe bilateral foraminal stenosis C3-C4. Otherwise, similar advanced multilevel canal/foraminal stenosis, as detailed above.  MRI brain and cervical spine wwo contrast 01/14/2022: Brain MRI: Stable mild for age chronic white matter disease with nonspecific pattern. No new or active finding.   Cervical MRI: 1. Stable demyelinating type plaque in the posterior cord at C2. No new or active finding. 2. Advanced cervical spine degeneration with multilevel foraminal impingement described above.  IMPRESSION/PLAN: Relapsing remitting multiple sclerosis (2021).  Diagnosed based on imaging which shows  scattered white matter changes involving the deep white matter, juxtacortical, corpus callosum, and cervical cord (C2-3) lesions.  She has opted not to start disease modifying therapy; fortunately, MRIs have been stable without progression of disease. Clinically, she continues to have bilateral hand numbness, worse on the left.  Given her clinical and imaging stability, will check MRI brain and cervical spine every 2 years, unless new symptoms develop.   Chronic left shoulder and left hip/leg pain.  Both seem to be characteristic of musculoskeletal pathology.    Return to clinic in 1 year   Thank you for allowing me to participate in patient's care.  If I can answer any additional questions, I  would be pleased to do so.    Sincerely,    Shalaunda Weatherholtz K. Allena Katz, DO

## 2023-10-06 IMAGING — MR MR HEAD WO/W CM
16 of 22 series · 36 of 48 positions shown · IV contrast (10 ml Gadavist)
Comparison: 09/26/2019.

CLINICAL DATA: Multiple sclerosis, monitor

EXAM:
MRI HEAD WITHOUT AND WITH CONTRAST
MRI CERVICAL SPINE WITHOUT AND WITH CONTRAST
TECHNIQUE: Multiplanar, multiecho pulse sequences of the brain and surrounding
structures were obtained without and with intravenous contrast.
Multiplanar, multisequence MR imaging of the cervical spine was
performed without and with contrast.
CONTRAST:  7.5mL GADAVIST GADOBUTROL 1 MMOL/ML IV SOLN

[Series 5: DWI · axial · 4.0mm · 0.88mm/px · z∈[-60,+79]mm · 4 of 36 slices shown (1 of 6)]
[im 1/36]
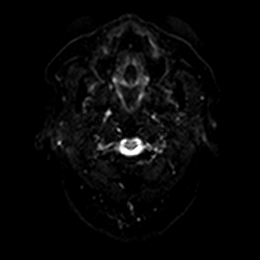
[im 12/36]
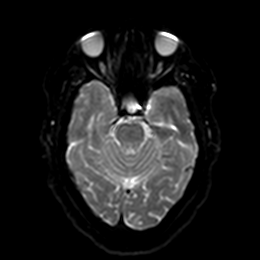
[im 24/36]
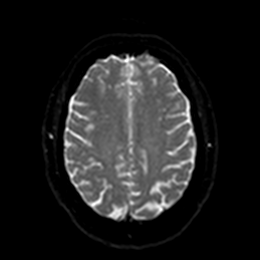
[im 36/36]
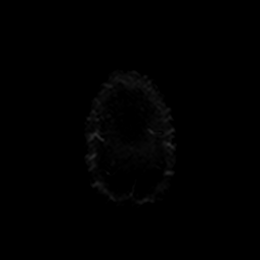

[Series 5: DWI · axial · 4.0mm · 0.88mm/px · z∈[-60,+79]mm · 4 of 36 slices shown (2 of 6)]
[im 1/36]
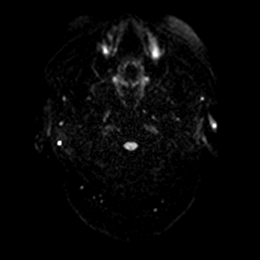
[im 12/36]
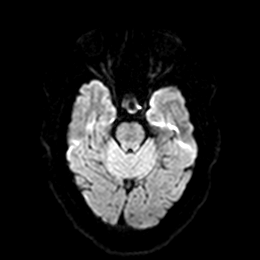
[im 24/36]
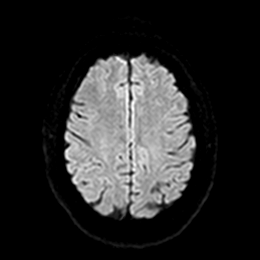
[im 36/36]
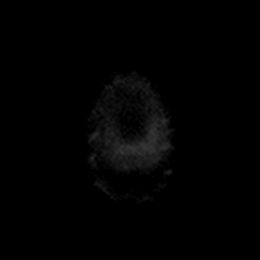

[Series 6: DWI · axial · 4.0mm · 0.88mm/px · z∈[-60,+79]mm · 3 of 36 slices shown (3 of 6)]
[im 1/36]
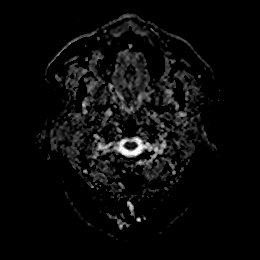
[im 18/36]
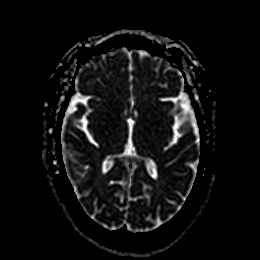
[im 36/36]
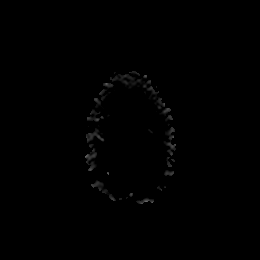

[Series 7: DWI · coronal · 5.0mm · 0.88mm/px · 2 of 28 slices shown (4 of 6)]
[im 1/28]
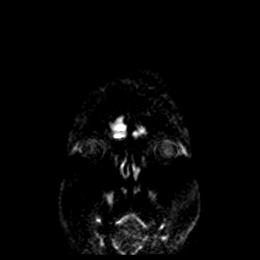
[im 28/28]
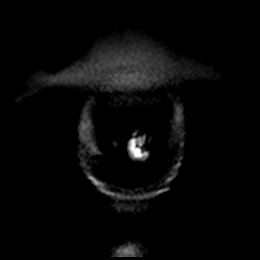

[Series 7: DWI · coronal · 5.0mm · 0.88mm/px · 2 of 28 slices shown (5 of 6)]
[im 1/28]
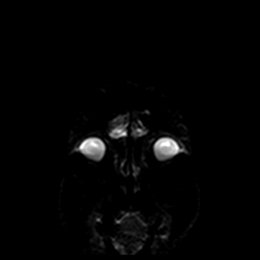
[im 28/28]
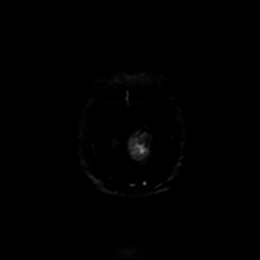

[Series 8: DWI · coronal · 5.0mm · 0.88mm/px · 2 of 28 slices shown (6 of 6)]
[im 1/28]
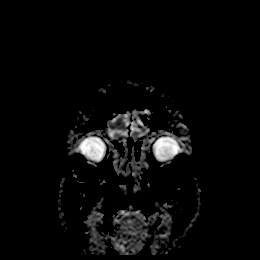
[im 28/28]
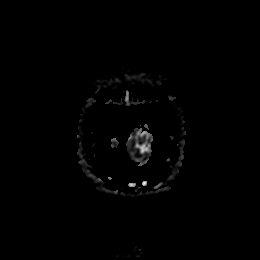

[Series 9: T1 · sagittal · 5.0mm · 0.94mm/px · 2 of 25 slices shown (1 of 3)]
[im 1/25]
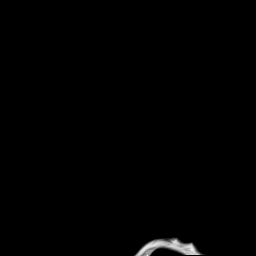
[im 25/25]
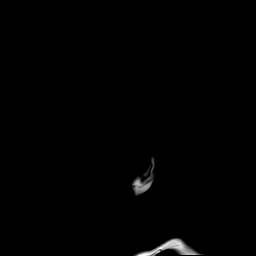

[Series 10: T2 · axial · 5.0mm · 0.72mm/px · z∈[-56,+75]mm · 2 of 20 slices shown (1 of 2)]
[im 1/20]
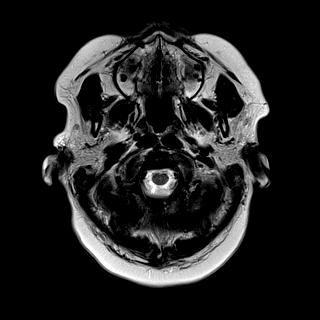
[im 20/20]
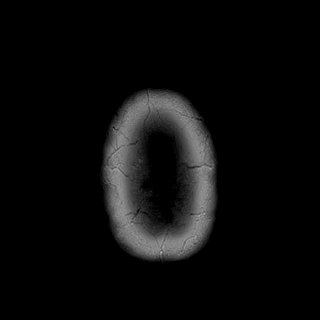

[Series 12: FLAIR · axial · 4.0mm · 0.43mm/px · z∈[-51,+71]mm · 2 of 32 slices shown]
[im 1/32]
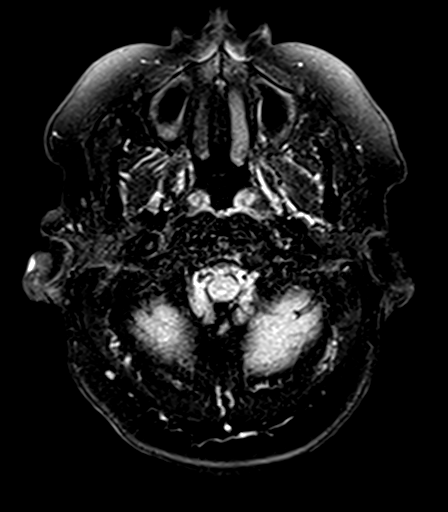
[im 32/32]
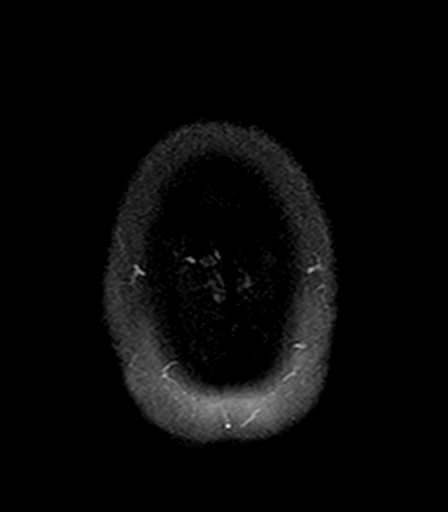

[Series 21: GRE · axial · 3.0mm · 0.78mm/px · z∈[-183,-92]mm · 2 of 30 slices shown]
[im 1/30]
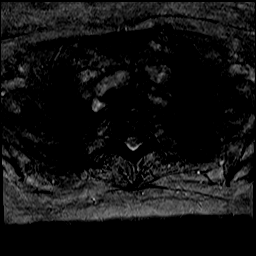
[im 30/30]
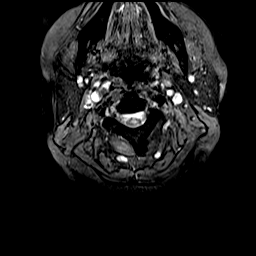

[Series 22: T1 · axial · non-contrast · 3.0mm · 0.35mm/px · z∈[-180,-89]mm · 2 of 30 slices shown (2 of 3)]
[im 1/30]
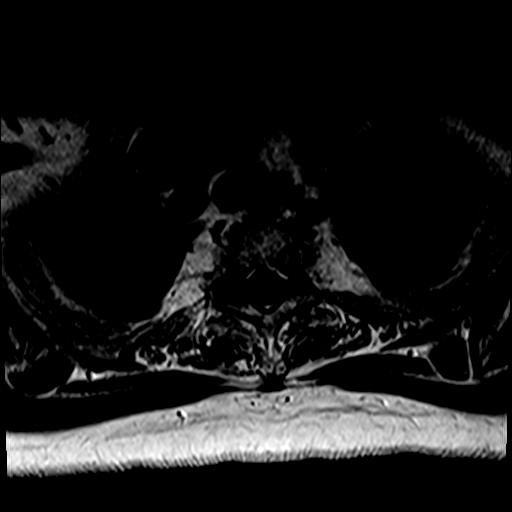
[im 30/30]
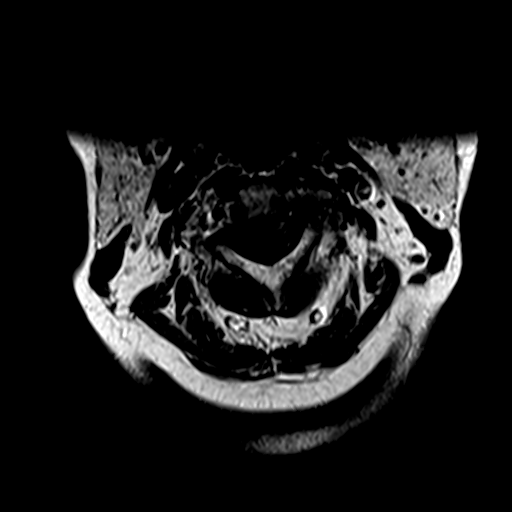

[Series 23: T1 post-contrast · sagittal · 3.0mm · 0.43mm/px · 1 of 15 slices shown (1 of 3)]
[im 1/15]
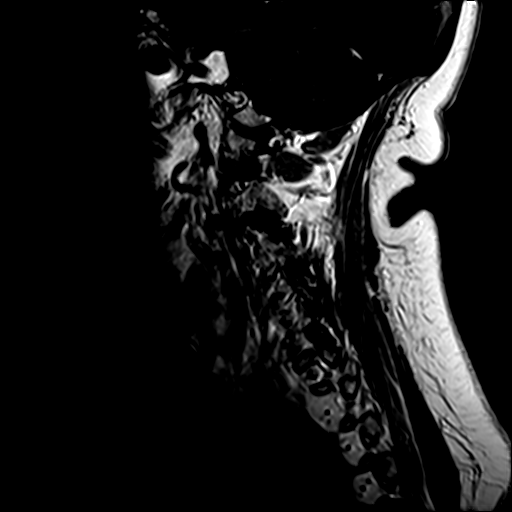

[Series 24: T1 post-contrast · axial · 3.0mm · 0.35mm/px · z∈[-180,-89]mm · 2 of 30 slices shown (2 of 3)]
[im 1/30]
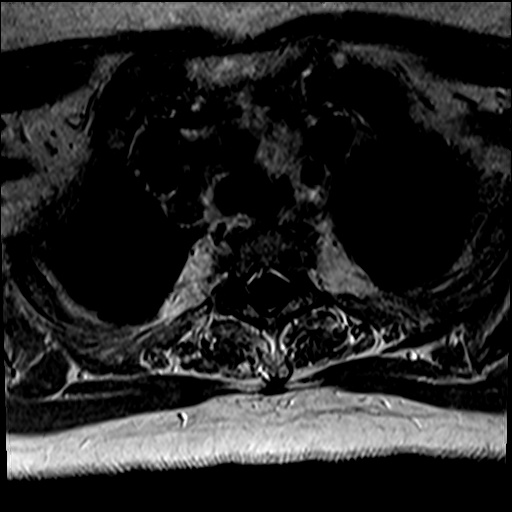
[im 30/30]
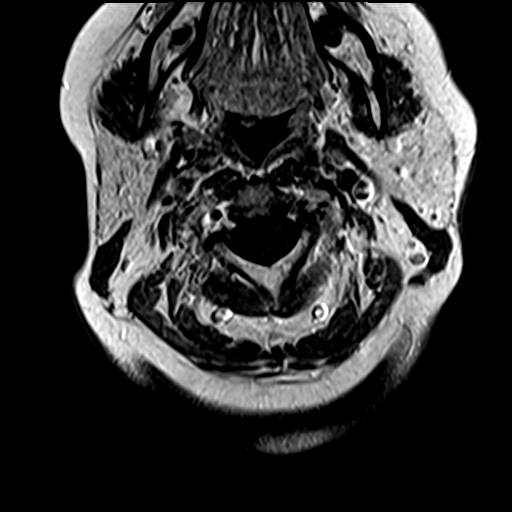

[Series 25: T2 · coronal · 5.0mm · 0.72mm/px · 2 of 28 slices shown (2 of 2)]
[im 1/28]
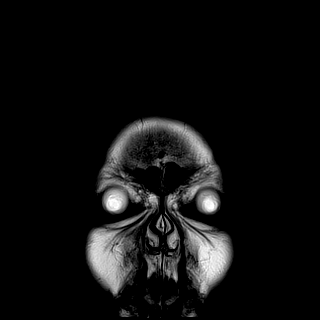
[im 28/28]
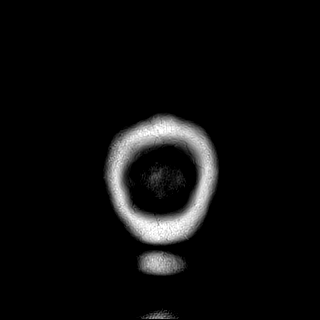

[Series 27: T1 post-contrast · coronal · 5.0mm · 0.34mm/px · 2 of 29 slices shown (3 of 3)]
[im 1/29]
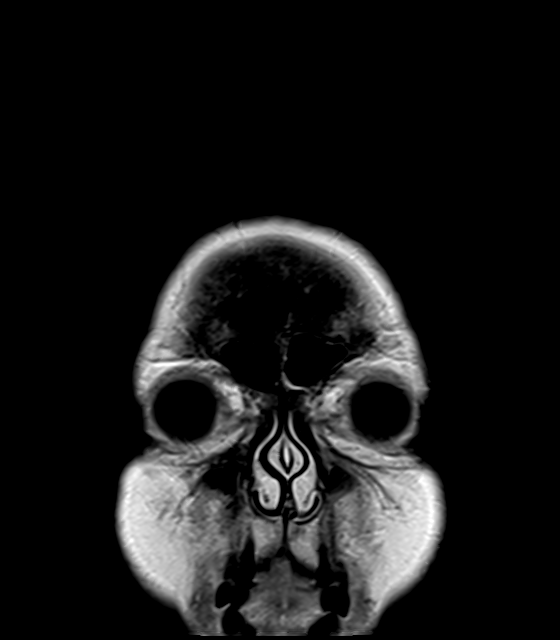
[im 29/29]
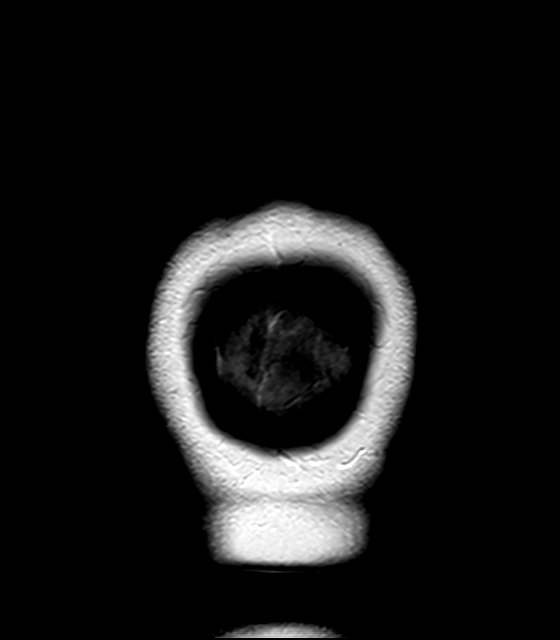

[Series 28: T1 · sagittal · 5.0mm · 0.94mm/px · 2 of 25 slices shown (3 of 3)]
[im 1/25]
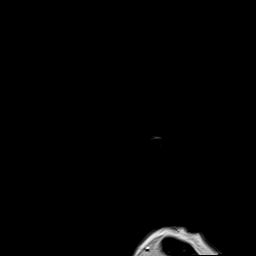
[im 25/25]
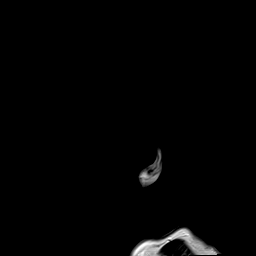

[36 of 48 positions shown; findings below may reference images not displayed]

FINDINGS: MRI HEAD:

Brain: Similar scattered T2/FLAIR hyperintensities within the white
matter, including juxtacortical and periventricular lesions. No new
lesions identified. No enhancing lesions. No acute infarct,
hydrocephalus, mass lesion, midline shift or extra-axial fluid
collection.

Vascular: Major arterial flow voids are maintained at the skull
base.

Skull and upper cervical spine: Normal marrow signal.

Sinuses/Orbits: Mild paranasal sinus mucosal thickening.
Unremarkable orbits on this non orbital protocol MRI.

Other: No mastoid effusions.

MRI CERVICAL SPINE:

Alignment: Reversal of the normal cervical lordosis. Similar
anterolisthesis of C3 on C4 and C4 on C5.

Vertebra: Heterogeneous bone marrow without suspicious bone lesion.
Degenerative/discogenic endplate signal changes, greatest at C5-C6.
No specific evidence of acute fracture or discitis/osteomyelitis.

Cord: Similar short-segment T2 hyperintensity in the dorsolateral
left cord at C2-C3. No new cord lesions. No abnormal cord
enhancement.

Disc levels:

C2-C3: Facet arthropathy without significant canal or foraminal
stenosis.

C3-C4: Similar anterolisthesis. Severe bilateral facet and
uncovertebral hypertrophy with severe bilateral foraminal stenosis,
progressed. Mild canal stenosis, similar.

C4-C5: Similar anterolisthesis. Facet and uncovertebral hypertrophy
bilaterally. Similar mild-to-moderate bilateral foraminal stenosis.
Mild canal stenosis.

C5-C6: Posterior disc osteophyte complex with right greater than
left facet and uncovertebral hypertrophy. Similar resulting moderate
bilateral foraminal stenosis, right greater than left. Mild canal
stenosis.

C6-C7: Posterior disc osteophyte complex. Right greater than left
facet and uncovertebral hypertrophy. Similar moderate right and mild
left foraminal stenosis. Mild canal stenosis.

C7-T1: Posterior disc osteophyte complex. Mild facet arthropathy and
mild bilateral foraminal stenosis. No significant canal stenosis.
IMPRESSION: MRI head:

1. Similar T2 hyperintensities within white matter in this patient
with reported multiple sclerosis. No new or enhancing lesions.
2. No acute abnormality.

MRI cervical spine:

1. Similar short-segment T2 hyperintensity in the cord at C2,
compatible with prior demyelination is patient for multiple
sclerosis. No new or enhancing cord lesions.
2. Multilevel degenerative change with progressive severe bilateral
foraminal stenosis C3-C4. Otherwise, similar advanced multilevel
canal/foraminal stenosis, as detailed above.

## 2023-10-26 LAB — COLOGUARD: COLOGUARD: NEGATIVE

## 2023-12-11 ENCOUNTER — Ambulatory Visit (INDEPENDENT_AMBULATORY_CARE_PROVIDER_SITE_OTHER): Payer: Medicare Other | Admitting: Neurology

## 2023-12-11 ENCOUNTER — Encounter: Payer: Self-pay | Admitting: Neurology

## 2023-12-11 VITALS — BP 151/74 | HR 82 | Ht 64.0 in | Wt 159.0 lb

## 2023-12-11 DIAGNOSIS — G35D Multiple sclerosis, unspecified: Secondary | ICD-10-CM

## 2023-12-11 DIAGNOSIS — G35 Multiple sclerosis: Secondary | ICD-10-CM | POA: Diagnosis not present

## 2023-12-11 NOTE — Patient Instructions (Signed)
-   MRI brain and cervical spine

## 2023-12-11 NOTE — Progress Notes (Signed)
 Follow-up Visit   Date: 12/11/23    [name] MRN: [id number] DOB: 1950/06/19   Interim History: Jennifer Osborn is a 73 y.o. right-handed African American female with hypertension, hyperlipidemia, and diabetes returning to the clinic for follow-up on multiple sclerosis.  The patient was accompanied to the clinic by self.  History of present illness: Starting around 2017, she began having numbness involving the entire left arm, which has been constant since onset.  No identifiable triggers or alleviating factors.  Recently, she has been having tight sensation over the upper arm.  She has difficulty with fine motor tasks because she drops things frequently from the numbness in her fingers.  In 2018, she developed mild tingling over the fingertips on the right. Prior evaluation has included electrodiagnostic testing which shows mild carpal tunnel syndrome bilaterally. 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     She denies any numbness/tingling of the legs, weakness of the arms or legs, vision changes, or difficulty swallowing/talking.  She occasionally has left leg cramps and episodic jerks.    UPDATE 01/27/2019:   At her prior visit, I recommended MRI brain and CSF testing to further evaluation her C2-3 cord abnormality, which was declined.  She has noticed mild increase in the numbness/tingling of the hands and mild weakness.   Repeat MRI cervical spine wwo contrast continues to show stable posterolateral cord T2 hyperintensity at C2-3, unchanged.    UPDATE 10/13/2019:  Since her last visit, she had MRI brain which showed scattered white matter changes, including deep, juxtacortical, and corpas callosum lesions. Diagnosis of multiple sclerosis was made.  UPDATE 02/13/2020:  She is here for follow-up with her husband.  She denies any new complaints and continues to have numbness in the hands, worse on the  left, as well as reduced dexterity in the left hand.  Overall, unchanged. She denies any new complaints.  MRI cervical spine shows stable cervical cord lesion and spondylosis.  No new areas of abnormality.  She reviewed disease modifying therapies that I discussed and would like to hold off on starting anything for now.   UPDATE 06/17/2020:  She is here for follow-up visit.  She does not have any new neurological complaints.  Her left hand remains numbness/tingling and she feels that it is weak at times, making fine motor tasks difficult. Overall, she is feeling well and she does not wish to start DMTs.   UPDATE 12/09/2021:  She is here for follow-up visit.  MRI brain and cervical spine from 2022 shows stable disease consistent with multiple sclerosis.  She does not have any new neurological symptoms.  Numbness in the hands is stable.  Earlier this year, she began having left shoulder pain, worse with movement.  She also complains of dull pain in the left thigh and hip, again worse when she is walking or standing. She has not tried taking tylenol or NSAID.  No radicular pain. She continues to have extreme fatigue, especially after waking up and having breakfast.  Often, she takes a nap and then feels more refreshed.   UPDATE 12/11/2022:  She is here for follow-up visit.  MRI brain and cervical spine from 2023 was stable without new findings.  She continues to have bilateral hand numbness and sometimes, worsening numbness in the left arm.  She has left shoulder bursitis and received injections, which has helped.  She continues to have  left thigh dull achy pain.  She has been seeing PCP or this.   UPDATE 12/11/2023:  She is here for 1 year follow-up visit.  She reports having ongoing numbness/tingling in the arms. She also has weakness in the left hand.  None of these symptoms are new.  She also has sharp left shoulder pain and achy pain in the left thigh.  In the past she has injection for shoulder bursitis, she  has not been seen for this again.    Medications:  Current Outpatient Medications on File Prior to Visit  Medication Sig Dispense Refill   aspirin 81 MG tablet Take 81 mg by mouth daily.     carvedilol (COREG) 12.5 MG tablet Take 12.5 mg by mouth 2 (two) times daily with a meal.     clobetasol cream (TEMOVATE) 0.05 %   1   ergocalciferol (VITAMIN D2) 50000 UNITS capsule Take 50,000 Units by mouth every 30 (thirty) days.     hydrochlorothiazide (HYDRODIURIL) 25 MG tablet Take 25 mg by mouth daily.     losartan (COZAAR) 100 MG tablet Take 100 mg by mouth daily.     metFORMIN (GLUCOPHAGE) 500 MG tablet Take 500 mg by mouth daily with breakfast.     propranolol (INDERAL) 10 MG tablet Take 10 mg by mouth daily.     rosuvastatin (CRESTOR) 20 MG tablet      torsemide (DEMADEX) 20 MG tablet Take 20 mg by mouth daily.     No current facility-administered medications on file prior to visit.    Allergies:  Allergies  Allergen Reactions   Percocet [Oxycodone-Acetaminophen]    Latex Rash    Vital Signs:  BP (!) 151/74   Pulse 82   Ht 5' 4 (1.626 m)   Wt 159 lb (72.1 kg)   SpO2 99%   BMI 27.29 kg/m   Neurological Exam: MENTAL STATUS including orientation to time, place, person, recent and remote memory, attention span and concentration, language, and fund of knowledge is normal.  Speech is not dysarthric.  CRANIAL NERVES: No visual field defects.  Pupils equal round and reactive to light.  Normal conjugate, extra-ocular eye movements in all directions of gaze.  No ptosis.  Face is symmetric.   MOTOR:  Motor strength is 5/5 in all extremities, except trace mild weakness with finger abductors and ABP on the left (4/5). No pronator drift.  Tone is normal.   MSRs:  Right                                                                 Left brachioradialis 2+  brachioradialis 2+  biceps 2+  biceps 2+  triceps 2+  triceps 2+  patellar 3+  patellar 3+  ankle jerk 2+  ankle jerk 2+   plantar response down  plantar response down   SENSORY:  Vibration reduced in the left hand as compared to the right, intact vibration in the legs.  COORDINATION/GAIT:  Normal finger-to- nose-finger. Finger tapping is slowed on the left.  Gait appears stable, unassisted. Mild unsteadiness with tandem gait, but able perform.    Data: MRI cervical spine dated 07/10/2017.  There is a focal area of increased T2 signal abnormality in the left hemicord at the C2-3 level.  Old  demyelinating plaque may appear this way and further testing is warranted.    MRI cervical spine wwo contrast 12/23/2018: 1. Similar appearance of left posterolateral cord T2 signal hyperintensity at C2-3 level. There is no associated enhancement. This most likely reflects demyelinating disease given the asymmetric appearance. Cord ischemia is still considered, but less likely given the symmetry. 2. No new lesions or significant other cord signal abnormality or enhancement. 3. Multilevel degenerative changes of the cervical spine are otherwise stable.  MRI brain wwo contrast 09/29/2019: 1. No acute intracranial abnormality. 2. Scattered foci of T2 hyperintensity within the white matter of the cerebral hemispheres including deep, juxtacortical white matter and corpus callosum. Findings are nonspecific. Differential diagnosis includes microvascular ischemic changes, demyelinating disease, vasculitis and other infectious/inflammatory processes.  Lab Results  Component Value Date   VITAMINB12 501 08/01/2017   Lab Results  Component Value Date   ESRSEDRATE 12 08/01/2017   MRI brain wwo contrast 09/26/2019: 1. No acute intracranial abnormality. 2. Scattered foci of T2 hyperintensity within the white matter of the cerebral hemispheres including deep, juxtacortical white matter and corpus callosum. Findings are nonspecific. Differential diagnosis includes microvascular ischemic changes, demyelinating disease, vasculitis and  other infectious/inflammatory processes.  MRI cervical spine wo contrast 01/14/2020: 1. Stable appearance of the dorsal cord at the C2-3 level centrally and towards the left. Mild volume loss. No evidence of mass effect or contrast enhancement. This is consistent with a old insult. Given the clinical diagnosis of multiple sclerosis, this is most consistent with an MS plaque. No sign of compressive myelopathy at this level. Differential diagnosis does include old vascular insult, this is not felt likely. 2. 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 Apparent fusion at C4-5 and possibly at C5-6. Bilateral bony foraminal narrowing at those levels. Bilateral bony foraminal narrowing at C6-7 right worse than left. 3. C7-T1 facet arthritis with 1 mm of anterolisthesis. Mild bilateral foraminal narrowing. 4. Upper thoracic spondylosis with bilateral foraminal stenosis at T1-2 and T2-3.   MRI brain and cervical spine wwo contrast 12/31/2020: 1. Similar T2 hyperintensities within white matter in this patient with reported multiple sclerosis. No new or enhancing lesions. 2. No acute abnormality.   MRI cervical spine: 1. Similar short-segment T2 hyperintensity in the cord at C2, compatible with prior demyelination is patient for multiple sclerosis. No new or enhancing cord lesions. 2. Multilevel degenerative change with progressive severe bilateral foraminal stenosis C3-C4. Otherwise, similar advanced multilevel canal/foraminal stenosis, as detailed above.  MRI brain and cervical spine wwo contrast 01/14/2022: Brain MRI: Stable mild for age chronic white matter disease with nonspecific pattern. No new or active finding.   Cervical MRI: 1. Stable demyelinating type plaque in the posterior cord at C2. No new or active finding. 2. Advanced cervical spine degeneration with multilevel foraminal impingement described above.  IMPRESSION/PLAN: Relapsing remitting multiple sclerosis (2021),  patient opted not to be on disease modifying therapies.  Diagnosed based on imaging which shows scattered white matter changes involving the deep white matter, juxtacortical, corpus callosum, and cervical cord (C2-3) lesions.  MRIs have been stable without progression of disease. Clinically, she continues to have bilateral hand numbness worse on the left where there is also some weakness.   - Check MRI brain and cervical spine wwo contrast.  If stable, consider checking less frequently  Chronic left shoulder and left hip/leg pain.  Both seem to be characteristic of musculoskeletal pathology.    Return to clinic in 1 year   Thank you for allowing  me to participate in patient's care.  If I can answer any additional questions, I would be pleased to do so.    Sincerely,    Tenna Lacko K. Tobie, DO

## 2023-12-21 ENCOUNTER — Ambulatory Visit (HOSPITAL_COMMUNITY)
Admission: RE | Admit: 2023-12-21 | Discharge: 2023-12-21 | Disposition: A | Discharge status: Home / Self Care | Source: Ambulatory Visit | Attending: Neurology | Admitting: Neurology

## 2023-12-21 ENCOUNTER — Ambulatory Visit: Payer: Self-pay | Admitting: Neurology

## 2023-12-21 DIAGNOSIS — G939 Disorder of brain, unspecified: Secondary | ICD-10-CM | POA: Diagnosis not present

## 2023-12-21 DIAGNOSIS — G35D Multiple sclerosis, unspecified: Secondary | ICD-10-CM

## 2023-12-21 DIAGNOSIS — G959 Disease of spinal cord, unspecified: Secondary | ICD-10-CM

## 2023-12-21 DIAGNOSIS — M47812 Spondylosis without myelopathy or radiculopathy, cervical region: Secondary | ICD-10-CM | POA: Diagnosis not present

## 2023-12-21 MED ORDER — GADOBUTROL 1 MMOL/ML IV SOLN
7.0000 mL | Freq: Once | INTRAVENOUS | Status: AC | PRN
  Administered 2023-12-21: 7 mL via INTRAVENOUS

## 2024-12-10 ENCOUNTER — Ambulatory Visit: Admitting: Neurology
